# Patient Record
Sex: Female | Born: 1935 | Race: White | Hispanic: No | Smoking: Never smoker
Health system: Southern US, Community
[De-identification: ages and names within clinical notes are randomized; demographics above are authoritative.]

## PROBLEM LIST (undated history)

## (undated) DIAGNOSIS — C189 Malignant neoplasm of colon, unspecified: Secondary | ICD-10-CM

## (undated) DIAGNOSIS — I4891 Unspecified atrial fibrillation: Secondary | ICD-10-CM

## (undated) DIAGNOSIS — M1990 Unspecified osteoarthritis, unspecified site: Secondary | ICD-10-CM

## (undated) DIAGNOSIS — R001 Bradycardia, unspecified: Secondary | ICD-10-CM

## (undated) DIAGNOSIS — K279 Peptic ulcer, site unspecified, unspecified as acute or chronic, without hemorrhage or perforation: Secondary | ICD-10-CM

## (undated) DIAGNOSIS — H3530 Unspecified macular degeneration: Secondary | ICD-10-CM

## (undated) DIAGNOSIS — I495 Sick sinus syndrome: Secondary | ICD-10-CM

## (undated) DIAGNOSIS — F0390 Unspecified dementia without behavioral disturbance: Secondary | ICD-10-CM

## (undated) HISTORY — DX: Unspecified osteoarthritis, unspecified site: M19.90

## (undated) HISTORY — DX: Bradycardia, unspecified: R00.1

## (undated) HISTORY — PX: CATARACT EXTRACTION: SUR2

## (undated) HISTORY — DX: Malignant neoplasm of colon, unspecified: C18.9

## (undated) HISTORY — DX: Sick sinus syndrome: I49.5

## (undated) HISTORY — DX: Unspecified macular degeneration: H35.30

## (undated) HISTORY — DX: Unspecified atrial fibrillation: I48.91

## (undated) HISTORY — DX: Peptic ulcer, site unspecified, unspecified as acute or chronic, without hemorrhage or perforation: K27.9

## (undated) HISTORY — PX: CHOLECYSTECTOMY: SHX55

## (undated) HISTORY — PX: TONSILLECTOMY: SUR1361

---

## 1998-05-02 ENCOUNTER — Ambulatory Visit (HOSPITAL_BASED_OUTPATIENT_CLINIC_OR_DEPARTMENT_OTHER): Admission: RE | Admit: 1998-05-02 | Discharge: 1998-05-02 | Payer: Self-pay

## 2000-04-05 HISTORY — PX: PARTIAL COLECTOMY: SHX5273

## 2000-08-10 ENCOUNTER — Ambulatory Visit (HOSPITAL_COMMUNITY): Admission: RE | Admit: 2000-08-10 | Discharge: 2000-08-10 | Payer: Self-pay | Admitting: Internal Medicine

## 2000-08-10 ENCOUNTER — Encounter: Payer: Self-pay | Admitting: Internal Medicine

## 2000-09-08 ENCOUNTER — Encounter: Payer: Self-pay | Admitting: *Deleted

## 2000-09-08 ENCOUNTER — Ambulatory Visit (HOSPITAL_COMMUNITY): Admission: RE | Admit: 2000-09-08 | Discharge: 2000-09-08 | Payer: Self-pay | Admitting: *Deleted

## 2000-10-10 ENCOUNTER — Inpatient Hospital Stay (HOSPITAL_COMMUNITY): Admission: RE | Admit: 2000-10-10 | Discharge: 2000-10-15 | Payer: Self-pay

## 2001-08-02 ENCOUNTER — Ambulatory Visit (HOSPITAL_COMMUNITY): Admission: RE | Admit: 2001-08-02 | Discharge: 2001-08-03 | Payer: Self-pay | Admitting: Ophthalmology

## 2001-08-15 ENCOUNTER — Emergency Department (HOSPITAL_COMMUNITY): Admission: EM | Admit: 2001-08-15 | Discharge: 2001-08-15 | Payer: Self-pay | Admitting: Emergency Medicine

## 2002-05-10 ENCOUNTER — Ambulatory Visit (HOSPITAL_COMMUNITY): Admission: RE | Admit: 2002-05-10 | Discharge: 2002-05-10 | Payer: Self-pay | Admitting: Internal Medicine

## 2003-11-27 ENCOUNTER — Ambulatory Visit (HOSPITAL_COMMUNITY): Admission: RE | Admit: 2003-11-27 | Discharge: 2003-11-27 | Payer: Self-pay | Admitting: Internal Medicine

## 2004-10-16 ENCOUNTER — Ambulatory Visit: Payer: Self-pay | Admitting: Cardiology

## 2004-10-16 ENCOUNTER — Inpatient Hospital Stay (HOSPITAL_COMMUNITY): Admission: EM | Admit: 2004-10-16 | Discharge: 2004-10-17 | Payer: Self-pay | Admitting: Emergency Medicine

## 2004-11-02 ENCOUNTER — Ambulatory Visit: Payer: Self-pay | Admitting: Cardiology

## 2005-03-19 ENCOUNTER — Ambulatory Visit (HOSPITAL_COMMUNITY): Admission: RE | Admit: 2005-03-19 | Discharge: 2005-03-19 | Payer: Self-pay | Admitting: Family Medicine

## 2005-04-29 ENCOUNTER — Encounter (HOSPITAL_COMMUNITY): Admission: RE | Admit: 2005-04-29 | Discharge: 2005-04-30 | Payer: Self-pay | Admitting: Unknown Physician Specialty

## 2005-05-03 ENCOUNTER — Encounter (HOSPITAL_COMMUNITY): Admission: RE | Admit: 2005-05-03 | Discharge: 2005-06-02 | Payer: Self-pay | Admitting: Unknown Physician Specialty

## 2005-06-04 ENCOUNTER — Encounter (HOSPITAL_COMMUNITY): Admission: RE | Admit: 2005-06-04 | Discharge: 2005-07-04 | Payer: Self-pay | Admitting: Unknown Physician Specialty

## 2006-03-08 ENCOUNTER — Ambulatory Visit (HOSPITAL_COMMUNITY): Admission: RE | Admit: 2006-03-08 | Discharge: 2006-03-08 | Payer: Self-pay | Admitting: Family Medicine

## 2006-03-14 ENCOUNTER — Ambulatory Visit (HOSPITAL_COMMUNITY): Admission: RE | Admit: 2006-03-14 | Discharge: 2006-03-14 | Payer: Self-pay | Admitting: Family Medicine

## 2006-03-17 ENCOUNTER — Encounter (HOSPITAL_COMMUNITY): Admission: RE | Admit: 2006-03-17 | Discharge: 2006-04-04 | Payer: Self-pay | Admitting: Family Medicine

## 2006-08-19 ENCOUNTER — Ambulatory Visit (HOSPITAL_COMMUNITY): Admission: RE | Admit: 2006-08-19 | Discharge: 2006-08-20 | Payer: Self-pay | Admitting: Surgery

## 2006-08-19 ENCOUNTER — Encounter (INDEPENDENT_AMBULATORY_CARE_PROVIDER_SITE_OTHER): Payer: Self-pay | Admitting: Surgery

## 2006-10-03 ENCOUNTER — Encounter (INDEPENDENT_AMBULATORY_CARE_PROVIDER_SITE_OTHER): Payer: Self-pay | Admitting: *Deleted

## 2009-04-01 ENCOUNTER — Encounter: Payer: Self-pay | Admitting: Cardiovascular Disease

## 2009-04-01 ENCOUNTER — Ambulatory Visit (HOSPITAL_COMMUNITY): Admission: RE | Admit: 2009-04-01 | Discharge: 2009-04-01 | Payer: Self-pay | Admitting: Family Medicine

## 2009-04-16 ENCOUNTER — Encounter: Payer: Self-pay | Admitting: Orthopedic Surgery

## 2009-04-16 ENCOUNTER — Encounter (INDEPENDENT_AMBULATORY_CARE_PROVIDER_SITE_OTHER): Payer: Self-pay | Admitting: *Deleted

## 2009-04-16 DIAGNOSIS — Z8711 Personal history of peptic ulcer disease: Secondary | ICD-10-CM

## 2009-04-16 DIAGNOSIS — C189 Malignant neoplasm of colon, unspecified: Secondary | ICD-10-CM | POA: Insufficient documentation

## 2009-04-16 DIAGNOSIS — H3530 Unspecified macular degeneration: Secondary | ICD-10-CM | POA: Insufficient documentation

## 2009-04-16 DIAGNOSIS — M1610 Unilateral primary osteoarthritis, unspecified hip: Secondary | ICD-10-CM | POA: Insufficient documentation

## 2009-04-17 ENCOUNTER — Ambulatory Visit: Payer: Self-pay | Admitting: Orthopedic Surgery

## 2009-04-17 ENCOUNTER — Encounter: Payer: Self-pay | Admitting: Adult Health

## 2009-04-17 ENCOUNTER — Ambulatory Visit: Payer: Self-pay | Admitting: Cardiovascular Disease

## 2009-04-17 DIAGNOSIS — I10 Essential (primary) hypertension: Secondary | ICD-10-CM | POA: Insufficient documentation

## 2009-04-17 DIAGNOSIS — M1710 Unilateral primary osteoarthritis, unspecified knee: Secondary | ICD-10-CM

## 2009-04-21 ENCOUNTER — Encounter: Payer: Self-pay | Admitting: Cardiology

## 2009-04-21 ENCOUNTER — Ambulatory Visit (HOSPITAL_COMMUNITY): Admission: RE | Admit: 2009-04-21 | Discharge: 2009-04-21 | Payer: Self-pay | Admitting: Cardiology

## 2009-04-21 ENCOUNTER — Ambulatory Visit: Payer: Self-pay | Admitting: Cardiology

## 2009-04-25 ENCOUNTER — Encounter: Payer: Self-pay | Admitting: Cardiology

## 2009-04-28 ENCOUNTER — Encounter: Payer: Self-pay | Admitting: Cardiology

## 2009-04-28 ENCOUNTER — Encounter (INDEPENDENT_AMBULATORY_CARE_PROVIDER_SITE_OTHER): Payer: Self-pay | Admitting: *Deleted

## 2009-04-28 LAB — CONVERTED CEMR LAB
AST: 19 units/L
Albumin: 4.3 g/dL
BUN: 12 mg/dL
Creatinine, Ser: 0.73 mg/dL
Glomerular Filtration Rate, Af Am: >60 mL/min/{1.73_m2}
Glucose, Bld: 100 mg/dL
Potassium: 3.6 meq/L
Sodium: 137 meq/L
TSH: 4.689 microintl units/mL

## 2009-05-08 ENCOUNTER — Encounter (INDEPENDENT_AMBULATORY_CARE_PROVIDER_SITE_OTHER): Payer: Self-pay | Admitting: *Deleted

## 2009-05-09 ENCOUNTER — Encounter (INDEPENDENT_AMBULATORY_CARE_PROVIDER_SITE_OTHER): Payer: Self-pay | Admitting: *Deleted

## 2009-05-09 ENCOUNTER — Ambulatory Visit: Payer: Self-pay | Admitting: Cardiology

## 2009-05-09 ENCOUNTER — Ambulatory Visit (HOSPITAL_COMMUNITY): Admission: RE | Admit: 2009-05-09 | Discharge: 2009-05-09 | Payer: Self-pay | Admitting: Cardiology

## 2009-05-09 DIAGNOSIS — I495 Sick sinus syndrome: Secondary | ICD-10-CM

## 2009-05-09 LAB — CONVERTED CEMR LAB
BUN: 14 mg/dL
Calcium: 8.6 mg/dL
Glucose, Bld: 85 mg/dL
Sodium: 144 meq/L

## 2009-05-12 ENCOUNTER — Ambulatory Visit: Payer: Self-pay | Admitting: Internal Medicine

## 2009-05-12 ENCOUNTER — Inpatient Hospital Stay (HOSPITAL_COMMUNITY): Admission: RE | Admit: 2009-05-12 | Discharge: 2009-05-13 | Payer: Self-pay | Admitting: Internal Medicine

## 2009-05-12 ENCOUNTER — Encounter (INDEPENDENT_AMBULATORY_CARE_PROVIDER_SITE_OTHER): Payer: Self-pay | Admitting: *Deleted

## 2009-05-12 LAB — CONVERTED CEMR LAB
BUN: 14 mg/dL (ref 6–23)
Basophils Absolute: 0 10*3/uL (ref 0.0–0.1)
Chloride: 104 meq/L (ref 96–112)
Creatinine, Ser: 0.66 mg/dL (ref 0.40–1.20)
Eosinophils Relative: 2 % (ref 0–5)
HCT: 42.6 % (ref 36.0–46.0)
Hemoglobin: 13.7 g/dL (ref 12.0–15.0)
MCV: 84.7 fL (ref 78.0–100.0)
Monocytes Absolute: 0.6 10*3/uL (ref 0.1–1.0)
Monocytes Relative: 7 % (ref 3–12)
Neutro Abs: 5.6 10*3/uL (ref 1.7–7.7)
Platelets: 256 10*3/uL (ref 150–400)
Potassium: 3.4 meq/L — ABNORMAL LOW (ref 3.5–5.3)
Prothrombin Time: 13.9 s (ref 11.6–15.2)
WBC: 7.7 10*3/uL (ref 4.0–10.5)

## 2009-05-13 ENCOUNTER — Encounter: Payer: Self-pay | Admitting: Internal Medicine

## 2009-05-19 ENCOUNTER — Encounter: Payer: Self-pay | Admitting: Cardiology

## 2009-05-21 ENCOUNTER — Ambulatory Visit: Payer: Self-pay

## 2009-05-21 ENCOUNTER — Encounter: Payer: Self-pay | Admitting: Internal Medicine

## 2009-06-02 ENCOUNTER — Ambulatory Visit: Payer: Self-pay | Admitting: Orthopedic Surgery

## 2009-06-16 ENCOUNTER — Encounter (INDEPENDENT_AMBULATORY_CARE_PROVIDER_SITE_OTHER): Payer: Self-pay | Admitting: *Deleted

## 2009-06-19 ENCOUNTER — Ambulatory Visit: Payer: Self-pay | Admitting: Cardiology

## 2009-07-02 ENCOUNTER — Telehealth: Payer: Self-pay | Admitting: Cardiology

## 2009-07-10 ENCOUNTER — Encounter: Payer: Self-pay | Admitting: Cardiology

## 2009-07-10 LAB — CONVERTED CEMR LAB
BUN: 10 mg/dL (ref 6–23)
CO2: 26 meq/L (ref 19–32)
Sodium: 143 meq/L (ref 135–145)

## 2009-07-16 ENCOUNTER — Telehealth: Payer: Self-pay | Admitting: Cardiology

## 2009-07-21 ENCOUNTER — Ambulatory Visit: Payer: Self-pay | Admitting: Cardiovascular Disease

## 2009-07-22 ENCOUNTER — Encounter: Payer: Self-pay | Admitting: Cardiology

## 2009-08-04 ENCOUNTER — Encounter: Payer: Self-pay | Admitting: Internal Medicine

## 2009-08-20 ENCOUNTER — Ambulatory Visit: Payer: Self-pay | Admitting: Internal Medicine

## 2009-08-20 ENCOUNTER — Encounter: Payer: Self-pay | Admitting: Cardiology

## 2009-08-20 ENCOUNTER — Encounter (INDEPENDENT_AMBULATORY_CARE_PROVIDER_SITE_OTHER): Payer: Self-pay | Admitting: *Deleted

## 2009-08-20 DIAGNOSIS — I4892 Unspecified atrial flutter: Secondary | ICD-10-CM

## 2009-08-21 ENCOUNTER — Encounter: Payer: Self-pay | Admitting: Internal Medicine

## 2009-08-21 DIAGNOSIS — Z950 Presence of cardiac pacemaker: Secondary | ICD-10-CM

## 2009-09-02 LAB — CONVERTED CEMR LAB
Basophils Absolute: 0 10*3/uL (ref 0.0–0.1)
CO2: 29 meq/L (ref 19–32)
Chloride: 107 meq/L (ref 96–112)
Creatinine, Ser: 0.67 mg/dL (ref 0.40–1.20)
Eosinophils Relative: 1 % (ref 0–5)
Glucose, Bld: 152 mg/dL — ABNORMAL HIGH (ref 70–99)
HCT: 38.4 % (ref 36.0–46.0)
Hemoglobin: 12.8 g/dL (ref 12.0–15.0)
MCV: 86.4 fL (ref 78.0–100.0)
Monocytes Absolute: 0.5 10*3/uL (ref 0.1–1.0)
Monocytes Relative: 8 % (ref 3–12)
Neutro Abs: 4.9 10*3/uL (ref 1.7–7.7)
Neutrophils Relative %: 72 % (ref 43–77)
Platelets: 233 10*3/uL (ref 150–400)
Potassium: 4 meq/L (ref 3.5–5.3)
Sodium: 142 meq/L (ref 135–145)
aPTT: 29 s (ref 24–37)

## 2009-09-08 ENCOUNTER — Ambulatory Visit (HOSPITAL_COMMUNITY): Admission: RE | Admit: 2009-09-08 | Discharge: 2009-09-08 | Payer: Self-pay | Admitting: Internal Medicine

## 2009-09-08 ENCOUNTER — Encounter: Payer: Self-pay | Admitting: Internal Medicine

## 2009-09-08 ENCOUNTER — Ambulatory Visit: Payer: Self-pay | Admitting: Internal Medicine

## 2009-09-11 ENCOUNTER — Encounter (INDEPENDENT_AMBULATORY_CARE_PROVIDER_SITE_OTHER): Payer: Self-pay | Admitting: *Deleted

## 2009-09-11 ENCOUNTER — Ambulatory Visit: Payer: Self-pay | Admitting: Cardiology

## 2009-09-11 LAB — CONVERTED CEMR LAB: POC INR: 3

## 2009-09-15 ENCOUNTER — Ambulatory Visit: Payer: Self-pay | Admitting: Cardiology

## 2009-09-15 LAB — CONVERTED CEMR LAB: POC INR: 3.7

## 2009-09-18 ENCOUNTER — Ambulatory Visit: Payer: Self-pay | Admitting: Cardiology

## 2009-09-18 LAB — CONVERTED CEMR LAB: POC INR: 3.2

## 2009-09-22 ENCOUNTER — Ambulatory Visit: Payer: Self-pay | Admitting: Internal Medicine

## 2009-09-22 ENCOUNTER — Inpatient Hospital Stay (HOSPITAL_COMMUNITY): Admission: RE | Admit: 2009-09-22 | Discharge: 2009-09-24 | Payer: Self-pay | Admitting: Internal Medicine

## 2009-09-23 LAB — CONVERTED CEMR LAB
BUN: 12 mg/dL (ref 6–23)
Creatinine, Ser: 0.58 mg/dL (ref 0.40–1.20)
Eosinophils Absolute: 0.1 10*3/uL (ref 0.0–0.7)
Eosinophils Relative: 1 % (ref 0–5)
HCT: 40.3 % (ref 36.0–46.0)
Hemoglobin: 12.6 g/dL (ref 12.0–15.0)
Lymphs Abs: 1.2 10*3/uL (ref 0.7–4.0)
MCV: 89 fL (ref 78.0–100.0)
Monocytes Absolute: 0.5 10*3/uL (ref 0.1–1.0)
Neutro Abs: 3.8 10*3/uL (ref 1.7–7.7)
Neutrophils Relative %: 68 % (ref 43–77)
Platelets: 210 10*3/uL (ref 150–400)
Prothrombin Time: 30.2 s — ABNORMAL HIGH (ref 11.6–15.2)
RDW: 14.4 % (ref 11.5–15.5)
WBC: 5.6 10*3/uL (ref 4.0–10.5)

## 2009-09-29 ENCOUNTER — Ambulatory Visit: Payer: Self-pay | Admitting: Cardiology

## 2009-09-29 LAB — CONVERTED CEMR LAB: POC INR: 5.2

## 2009-10-01 ENCOUNTER — Ambulatory Visit: Payer: Self-pay | Admitting: Cardiology

## 2009-10-01 ENCOUNTER — Ambulatory Visit: Payer: Self-pay | Admitting: Internal Medicine

## 2009-10-01 LAB — CONVERTED CEMR LAB: POC INR: 3.9

## 2009-10-09 ENCOUNTER — Ambulatory Visit: Payer: Self-pay | Admitting: Cardiology

## 2009-10-09 LAB — CONVERTED CEMR LAB: POC INR: 4.4

## 2009-10-15 ENCOUNTER — Ambulatory Visit: Payer: Self-pay | Admitting: Cardiology

## 2009-10-15 LAB — CONVERTED CEMR LAB: POC INR: 3.6

## 2009-10-30 ENCOUNTER — Ambulatory Visit: Payer: Self-pay | Admitting: Cardiology

## 2009-10-30 LAB — CONVERTED CEMR LAB: POC INR: 3.2

## 2009-11-17 ENCOUNTER — Ambulatory Visit: Payer: Self-pay | Admitting: Cardiology

## 2009-12-04 ENCOUNTER — Ambulatory Visit: Payer: Self-pay | Admitting: Internal Medicine

## 2009-12-10 ENCOUNTER — Ambulatory Visit: Payer: Self-pay | Admitting: Cardiology

## 2009-12-29 ENCOUNTER — Telehealth (INDEPENDENT_AMBULATORY_CARE_PROVIDER_SITE_OTHER): Payer: Self-pay | Admitting: *Deleted

## 2009-12-31 ENCOUNTER — Ambulatory Visit: Payer: Self-pay | Admitting: Cardiology

## 2010-01-21 ENCOUNTER — Ambulatory Visit: Payer: Self-pay | Admitting: Cardiology

## 2010-02-04 ENCOUNTER — Ambulatory Visit: Payer: Self-pay | Admitting: Cardiology

## 2010-02-09 ENCOUNTER — Telehealth: Payer: Self-pay | Admitting: Internal Medicine

## 2010-02-23 ENCOUNTER — Ambulatory Visit: Payer: Self-pay | Admitting: Cardiology

## 2010-02-23 LAB — CONVERTED CEMR LAB: POC INR: 3.6

## 2010-03-12 ENCOUNTER — Encounter (INDEPENDENT_AMBULATORY_CARE_PROVIDER_SITE_OTHER): Payer: Self-pay | Admitting: *Deleted

## 2010-03-12 ENCOUNTER — Ambulatory Visit: Payer: Self-pay | Admitting: Cardiology

## 2010-03-12 LAB — CONVERTED CEMR LAB: POC INR: 2.5

## 2010-04-09 ENCOUNTER — Encounter: Payer: Self-pay | Admitting: Internal Medicine

## 2010-04-09 ENCOUNTER — Ambulatory Visit: Admission: RE | Admit: 2010-04-09 | Discharge: 2010-04-09 | Payer: Self-pay | Source: Home / Self Care

## 2010-04-09 ENCOUNTER — Ambulatory Visit
Admission: RE | Admit: 2010-04-09 | Discharge: 2010-04-09 | Payer: Self-pay | Source: Home / Self Care | Attending: Internal Medicine | Admitting: Internal Medicine

## 2010-04-09 DIAGNOSIS — R0602 Shortness of breath: Secondary | ICD-10-CM | POA: Insufficient documentation

## 2010-04-09 LAB — CONVERTED CEMR LAB: POC INR: 2.4

## 2010-04-10 LAB — CONVERTED CEMR LAB
Basophils Absolute: 0 10*3/uL (ref 0.0–0.1)
Eosinophils Absolute: 0.1 10*3/uL (ref 0.0–0.7)
Eosinophils Relative: 1 % (ref 0–5)
HCT: 35.5 % — ABNORMAL LOW (ref 36.0–46.0)
Lymphocytes Relative: 8 % — ABNORMAL LOW (ref 12–46)
MCV: 87.2 fL (ref 78.0–100.0)
Platelets: 344 10*3/uL (ref 150–400)
RDW: 15.9 % — ABNORMAL HIGH (ref 11.5–15.5)
Sed Rate: 55 mm/hr — ABNORMAL HIGH (ref 0–22)

## 2010-04-13 ENCOUNTER — Encounter (INDEPENDENT_AMBULATORY_CARE_PROVIDER_SITE_OTHER): Payer: Self-pay | Admitting: *Deleted

## 2010-04-26 ENCOUNTER — Encounter: Payer: Self-pay | Admitting: Family Medicine

## 2010-04-26 ENCOUNTER — Encounter: Payer: Self-pay | Admitting: Internal Medicine

## 2010-05-05 NOTE — Assessment & Plan Note (Signed)
Summary: 6 WK RE-CK LT KNEE/MEDICARE,CENTR RES/CAF   Visit Type:  Follow-up Referring Provider:  Dr Nobie Putnam  Primary Provider:  Dr Nobie Putnam   CC:  left knee fu.  History of Present Illness: I saw Wendy Garner in the office today for a followup visit.  She is a 77 years old woman with the complaint of:  DX: Left knee pain, OA.  Fell injured her LEFT knee, now doing better.  Likes to wear the brace thinks it's giving her more support  Treatment: brace, walker, voltaren gel.  Complaints: brace helped.  Today, scheduled for: 6 week recheck left knee.  She has valgus alignment to the LEFT knee.  No pain or tenderness today.  No swelling.  Muscle tone and strength are normal.  Knee is stable.  Impression LEFT knee pain resolved  Use brace as needed encouraged to use cane warned about falling and need for hip surgery and then she would have to go to a nursing home.  Follow up as needed    Current Medications (verified): 1)  Nexium 40 Mg Cpdr (Esomeprazole Magnesium) .... Take 1 Tab Daily 2)  Eye Drops 0.05 % Soln (Tetrahydrozoline Hcl) .... Use As Directed 3)  Synthroid 125 Mcg Tabs (Levothyroxine Sodium) .... Take 1 Tab Daily 4)  Daily Multiple Vitamins  Tabs (Multiple Vitamin) .... Take 1 Tab Daily 5)  Vision Vitamins  Tabs (Multiple Vitamins-Minerals) .... Take 1 Tab Daily 6)  Calcium Carbonate 600 Mg Tabs (Calcium Carbonate) .... Take 1 Tab Daily 7)  Voltaren 1 % Gel (Diclofenac Sodium) .... Take As Directed 8)  Metoprolol Succinate 100 Mg Xr24h-Tab (Metoprolol Succinate) .... Take One Tablet By Mouth Daily 9)  Diltiazem Hcl Er Beads 120 Mg Xr24h-Cap (Diltiazem Hcl Er Beads) .... Take One Capsule By Mouth Daily  Allergies (verified): No Known Drug Allergies  Past History:  Past Medical History: Last updated: 06/03/09 Sick sinus syndrome: Paroxysmal atrial fibrillation with a rapid ventricular response;       extreme sinus bradycardia, junctional bradycardia and  sinus arrest up to 4 seconds MACULAR DEGENERATION (ICD-362.50) Peptic ulcer disease with history of upper GI bleeding in 2004 Degenerative joint disease-hip and lumbosacral spine ADENOCARCINOMA, COLON (ICD-153.9) ATRIAL FIBRILLATION, HX OF (ICD-V12.59)  Past Surgical History: Last updated: Jun 03, 2009 Cataract extraction Cholecystectomy Tonsillectomy Partial colectomy for carcinoma of the colon-2002  Family History: Last updated: 06/03/09 Mother died at an advanced stage; cause unknown Father suffered a fatal CVA Siblings: 4 sisters, 3 of whom died with neoplastic disease  Social History: Last updated: 06/03/2009 Employment:Retired from work with [employer] and resides in [place]; 2 adult children No history of tobacco use No history of significant alcohol use  Risk Factors: Alcohol Use: 0 (04/17/2009)  Risk Factors: Smoking Status: never (04/17/2009)   Impression & Recommendations:  Problem # 1:  KNEE, ARTHRITIS, DEGEN./OSTEO (ICD-715.96) Assessment Improved  Orders: Est. Patient Level III ([address])  Patient Instructions: 1)  You can take the brace off  2)  Use a cane or walker as needed  3)  f/u as needed

## 2010-05-05 NOTE — Letter (Signed)
Summary: BP READING  BP READING   Imported By: Faythe Ghee 08/20/2009 16:05:36  _____________________________________________________________________  External Attachment:    Type:   Image     Comment:   External Document

## 2010-05-05 NOTE — Assessment & Plan Note (Signed)
Summary: eph   Visit Type:  Follow-up Referring Provider:  Dr Nobie Putnam  Primary Provider:  Dr Nobie Putnam   CC:  SOB.  History of Present Illness: Wendy Garner is referred for evaluation of atrial flutter.  She is a pleasant elderly woman with a h/o symptomatic bradycardia who is s/p PPM insertion.  She has felt poorly over the last two months and this coincides with the development of atrial flutter.  The patient does not fee palpitations but she notes that with exertion, she has dyspnea.  No other complaints.  Current Medications (verified): 1)  Nexium 40 Mg Cpdr (Esomeprazole Magnesium) .... Take 1 Tab Daily 2)  Eye Drops 0.05 % Soln (Tetrahydrozoline Hcl) .... Use As Directed 3)  Synthroid 125 Mcg Tabs (Levothyroxine Sodium) .... Take 1 Tab Daily 4)  Daily Multiple Vitamins  Tabs (Multiple Vitamin) .... Take 1 Tab Daily 5)  Vision Vitamins  Tabs (Multiple Vitamins-Minerals) .... Take 1 Tab Daily 6)  Calcium Carbonate 600 Mg Tabs (Calcium Carbonate) .... Take 1 Tab Daily 7)  Metoprolol Succinate 100 Mg Xr24h-Tab (Metoprolol Succinate) .... Take One Tablet By Mouth Daily 8)  Diltiazem Hcl Er Beads 240 Mg Xr24h-Cap (Diltiazem Hcl Er Beads) .... Take 1 Tablet By Mouth Once Daily  Allergies (verified): No Known Drug Allergies  Past History:  Past Medical History: Last updated: 05-16-09 Sick sinus syndrome: Paroxysmal atrial fibrillation with a rapid ventricular response;       extreme sinus bradycardia, junctional bradycardia and sinus arrest up to 4 seconds MACULAR DEGENERATION (ICD-362.50) Peptic ulcer disease with history of upper GI bleeding in 2004 Degenerative joint disease-hip and lumbosacral spine ADENOCARCINOMA, COLON (ICD-153.9) ATRIAL FIBRILLATION, HX OF (ICD-V12.59)  Past Surgical History: Last updated: 2009-05-16 Cataract extraction Cholecystectomy Tonsillectomy Partial colectomy for carcinoma of the colon-2002  Family History: Last updated: May 16, 2009 Mother  died at an advanced stage; cause unknown Father suffered a fatal CVA Siblings: 4 sisters, 3 of whom died with neoplastic disease  Social History: Last updated: 2009-05-16 Employment:Retired from work with [employer] and resides in [place]; 2 adult children No history of tobacco use No history of significant alcohol use  Review of Systems       The patient complains of dyspnea on exertion.  The patient denies chest pain, syncope, and peripheral edema.    Vital Signs:  Patient profile:   77 year old female Weight:      133 pounds BMI:     20.30 Pulse rate:   115 / minute BP sitting:   121 / 73  (right arm)  Vitals Entered By: Dreama Saa, CNA (Aug 20, 2009 2:00 PM)  Physical Exam  General:    Well developed; no acute distress; proportionate height and weight Neck-No JVD; no carotid bruits: Lungs-normal respiratory rate; clear lung fields except for minimal left basilar rales. Thorax-pacing generator implanted in the left infraclavicular region; no edema or hematoma; well-healed incision. Cardiovascular-normal PMI; normal S1 and S2; modest basilar systolic ejection murmur Abdomen-BS normal; soft and non-tender without masses or organomegaly:  Musculoskeletal-No deformities, no cyanosis or clubbing: Neurologic-Normal cranial nerves; symmetric strength and tone:  Skin-Warm, no significant lesions: Extremities-Nl distal pulses; no edema:     EKG  Procedure date:  08/20/2009  Findings:      Atrial flutter with a ventricular response rate of:  115/min.  PPM Specifications Following MD:  Lewayne Bunting, MD     PPM Vendor:  Medtronic     PPM Model Number:  [id number]  PPM Serial Number:  [id number] PPM DOI:  05/12/2009     PPM Implanting MD:  Sherryl Manges, MD  Lead 1    Location: RA     DOI: 05/12/2009     Model #: 1944     Serial #: [id number]     Status: active Lead 2    Location: RV     DOI: 05/12/2009     Model #: 1946     Serial #: [id number]     Status:  active  Magnet Response Rate:  BOL 85 ERI  65  Indications:  Sick sinus syndrome   PPM Follow Up Remote Check?  No Battery Voltage:  2.79 V     Battery Est. Longevity:  13 years     Pacer Dependent:  No       PPM Device Measurements Atrium  Amplitude: 0.7 mV, Impedance: 518 ohms, Threshold: 0.5 V at 0.4 msec Right Ventricle  Amplitude: 16 mV, Impedance: 951 ohms, Threshold: 0.375 V at 0.4 msec  Episodes MS Episodes:  1518     Percent Mode Switch:  76.5%     Coumadin:  No Ventricular High Rate:  1     Atrial Pacing:  13.1%     Ventricular Pacing:  6%  Parameters Mode:  MVP (R)     Lower Rate Limit:  60     Upper Rate Limit:  130 Paced AV Delay:  150     Sensed AV Delay:  120 Tech Comments:  A-flutter with RVR, - coumadin.  Wendy Garner c/o SOB @ rest.  18.7% total heart beats > 100bpm.   Altha Harm, LPN  Aug 20, 2009 2:18 PM  MD Comments:  Agree with above.  Impression & Recommendations:  Problem # 1:  ATRIAL FLUTTER (ICD-427.32)  Her symptoms are not well controlled.  She is symptomatic and her rate is increased despite medical therapy.  I have discussed the risks/benefits/goals/expectations with the patient and her daughter and she wishes to proceed. Her updated medication list for this problem includes:    Metoprolol Succinate 100 Mg Xr24h-tab (Metoprolol succinate) .Marland Kitchen... Take one tablet by mouth daily    Warfarin Sodium 3 Mg Tabs (Warfarin sodium) ..... Use as directed by anticoagualtion clinic  Orders: Ablation (Ablation) Trans Esophageal Echocardiogram (TEE)Future Orders: T-Protime, Auto ([id number]) ... 09/03/2009 T-PTT ([id number]) ... 09/03/2009 T-CBC w/Diff ([id number]) ... 09/03/2009 T-Basic Metabolic Panel [id number]) ... 09/03/2009  Problem # 2:  HYPERTENSION (ICD-401.9) Her blood pressure is reasonably well controlled.  She will continue her current meds and maintain a low sodium diet. Her updated medication list for this problem includes:     Metoprolol Succinate 100 Mg Xr24h-tab (Metoprolol succinate) .Marland Kitchen... Take one tablet by mouth daily    Diltiazem Hcl Er Beads 240 Mg Xr24h-cap (Diltiazem hcl er beads) .Marland Kitchen... Take 1 tablet by mouth once daily  Future Orders: T-Protime, Auto ([id number]) ... 09/03/2009 T-PTT ([id number]) ... 09/03/2009 T-CBC w/Diff ([id number]) ... 09/03/2009 T-Basic Metabolic Panel [id number]) ... 09/03/2009  Problem # 3:  CARDIAC PACEMAKER IN SITU (ICD-V45.01) Her device is working normally.  Will followup after ablation.  New Orders:     1)  T-Protime, Auto [id number])  Due: 09/03/2009     2)  T-PTT [id number])  Due: 09/03/2009     3)  T-CBC w/Diff ([id number])  Due: 09/03/2009     4)  T-Basic Metabolic Panel ([id number])  Due: 09/03/2009     5)  Ablation (Ablation)  Due: 08/20/2009     6)  Trans Esophageal Echocardiogram (TEE)  Due: 08/20/2009   Patient Instructions: 1)  Your physician recommends that you schedule a follow-up appointment in: post procedure 2)  Your physician recommends that you return for lab work in: 09/01/09 3)  Your physician has recommended that you have an ablation.  Catheter ablation is a medical procedure used to treat some cardiac arrhythmias (irregular heartbeats). During catheter ablation, a long, thin, flexible tube is put into a blood vessel in your groin (upper thigh), or neck. This tube is called an ablation catheter. It is then guided to your heart through the blood vessel. Radiofrequency waves destroy small areas of heart tissue where abnormal heartbeats may cause an arrhythmia to start.  Please see the instruction sheet given to you today. 4)  Your physician has requested that you have a TEE/Cardioversion.  During a TEE, sound waves are used to create images of your heart. It provides your doctor with information about the size and shape of your heart and how well your heart's chambers and valves are working. In this test, a transducer is attached to the end of a  flexible tube that is guided down your throat and into your esophagus (the tube leading from your mouth to your stomach) to get a more detailed image of your heart. Once the TEE has determined that a blood clot is not present, the cardioversion begins.  Electrical cardioversion uses a jolt of electricity to your heart either through paddles or wired patches attached to your chest. This is a controlled, usually prescheduled, procedure. This procedure is done at the hospital and you are not awake during the procedure.  You usually go home the day of the procedure. Please see the instruction sheet given to you today for more information. 5)  Your physician has recommended you make the following change in your medication: warfarin 3 mg daily, start on 09/04/09 Prescriptions: WARFARIN SODIUM 3 MG TABS (WARFARIN SODIUM) Use as directed by Anticoagualtion Clinic  #30 x 1   Entered by:   Teressa Lower RN   Authorized by:   Laren Boom, MD, [facility]   Signed by:   Teressa Lower RN on 08/20/2009   Method used:   Electronically to        [facility] (retail)       [address]       [place], [place]  [address]       Ph: [phone] or [phone]       Fax: [phone]   RxID:   [id number]

## 2010-05-05 NOTE — Progress Notes (Signed)
Summary: pt having sob not eating or sleeping   Phone Note Call from Patient Call back at Home Phone [phone] Call back at [phone]   Caller: Daughter/ Lady Gary Summary of Call: Pt not sleeping well not eating and having sob was told to call  here by the [facility] office,daughter want pt seen this week here in [place] office  Initial call taken by: Judie Grieve,  February 09, 2010 8:36 AM  Follow-up for Phone Call        will follow up with her primary doctor first  She has an apponitment on Friday Dennis Bast, RN, BSN  February 09, 2010 12:36 PM

## 2010-05-05 NOTE — Progress Notes (Signed)
  Phone Note Call from Patient Call back at [phone]   Caller: Daughter Wendy Garner) Reason for Call: Talk to Nurse Summary of Call: pt's daughter wants you to call her b/c she has question she wants to ask you/tg Initial call taken by: Raechel Ache [facility],  July 02, 2009 10:17 AM  Follow-up for Phone Call        pt and family could not remember why she needed blood work, explained her hypokalemia in February and this is a recheck Follow-up by: Teressa Lower RN,  July 02, 2009 10:56 AM

## 2010-05-05 NOTE — Assessment & Plan Note (Signed)
Summary: 1 MTH F/U PER CHECKOUT ON 06/19/09/TG  Nurse Visit   Vital Signs:  Patient profile:   77 year old female Weight:      133 pounds Pulse rate:   91 / minute BP sitting:   157 / 83  (left arm)  Vitals Entered By: Larita Fife Via LPN (July 21, 2009 9:50 AM)  Primary Provider:  Dr Nobie Putnam    History of Present Illness: S: Pt. arrives in office for BP check/nurse visit. B: Pt. started taking Diltiazen 120mg  once daily and Metoprolol 100mg  once daily on hosp. discharge of 05-23-09 post pacemaker placement. A: Pt. c/o extreme dizziness that only occured for a short while yesterday and rapid HB. Pt. denies stomach pain at this time. BP= 157/83 which is higher than most home BP's (scanned into chart).  AVG. SBP=129 and AVG. DBP=75. Orthostatic BP's entered in chart. R: Pt. advised to sit and stand up slowly and if there are any changes in her medical treatment we will contact her.    07/24/09:  Increase diltiazem to 240 mg. once daily.   Have pacemaker checked by Rep. and have him leave report for my review.  [place] Bing, M.D.    Pt. advised and she expressed verbal understanding. [employer] rep will check pt's device on Tues, 07-29-09 @ 3:00pm.        Larita Fife Via LPN  July 24, 2009 4:16 PM   Impression & Recommendations:  Problem # 1:  SICK SINUS SYNDROME (ICD-427.81) 07/24/09:  Increase diltiazem to 240 mg. once daily.   Have pacemaker checked by Rep. and have him leave report for my review.  [place] Bing, M.D.   Serial Vital Signs/Assessments:  Time      Position  BP       Pulse  Resp  Temp     By 9:50 AM   Lying LA  155/82   82                    Lynn Via LPN 3:08 AM   Sitting   138/80   73                    Lynn Via LPN 6:57 AM   Standing  149/79   70                    Lynn Via LPN  Comments: 8:46 AM No complaints By: Larita Fife Via LPN    Allergies: No Known Drug Allergies  Appended Document: 1 MTH F/U PER CHECKOUT ON 06/19/09/TG    Prescriptions: DILTIAZEM  HCL ER BEADS 240 MG XR24H-CAP (DILTIAZEM HCL ER BEADS) take 1 tablet by mouth once daily  #30 x 6   Entered by:   Larita Fife Via LPN   Authorized by:   Kathlen Brunswick, MD, [facility]   Signed by:   Larita Fife Via LPN on 96/29/5284   Method used:   Electronically to        [facility] (retail)       [address]       [place], [place]  [address]       Ph: [phone] or [phone]       Fax: [phone]   RxID:   [id number]

## 2010-05-05 NOTE — Letter (Signed)
Summary: BP READINGS  BP READINGS   Imported By: Faythe Ghee 07/22/2009 09:59:53  _____________________________________________________________________  External Attachment:    Type:   Image     Comment:   External Document

## 2010-05-05 NOTE — Medication Information (Signed)
Summary: ccr-lr  Anticoagulant Therapy  Managed by: Vashti Hey, RN PCP: [facility]  Supervising MD: Daleen Squibb MD, Darene Lamer Used: [facility] [place] Site: [place] INR POC 2.5  Dietary changes: no    Health status changes: no    Bleeding/hemorrhagic complications: no    Recent/future hospitalizations: no    Any changes in medication regimen? no    Recent/future dental: no  Any missed doses?: no       Is patient compliant with meds? yes       Allergies: No Known Drug Allergies  Anticoagulation Management History:      The patient is taking warfarin and comes in today for a routine follow up visit.  Positive risk factors for bleeding include an age of 75 years or older.  The bleeding index is 'intermediate risk'.  Positive CHADS2 values include History of HTN and Age > 67 years old.  Her last INR was 2.91.  Anticoagulation responsible provider: Daleen Squibb MD, Maisie Fus.  INR POC: 2.5.  Cuvette Lot#: [id number].    Anticoagulation Management Assessment/Plan:      The patient's current anticoagulation dose is Warfarin sodium 3 mg tabs: Use as directed by Anticoagualtion Clinic.  The target INR is 2.0-3.0.  The next INR is due 04/09/2010.  Anticoagulation instructions were given to patient.  Results were reviewed/authorized by Vashti Hey, RN.  She was notified by Vashti Hey RN.         Prior Anticoagulation Instructions: INR 3.6 Decrease coumadin to 1/2 tablet once daily except none on Mondays and Fridays  Current Anticoagulation Instructions: INR 2.5 Continue coumadin 1.5mg  once daily except none on Mondays and Fridays

## 2010-05-05 NOTE — Letter (Signed)
Summary: Handout Printed  Printed Handout:  - Diet - Seasoning without Salt 

## 2010-05-05 NOTE — Medication Information (Signed)
Summary: ccr-lr  Anticoagulant Therapy  Managed by: Vashti Hey, RN PCP: Dr Nobie Putnam  Supervising MD: Dietrich Pates MD, Darlyn Read Used: [facility] [place] Site: [place] INR POC 3.7  Dietary changes: no    Health status changes: no    Bleeding/hemorrhagic complications: no    Recent/future hospitalizations: no    Any changes in medication regimen? no    Recent/future dental: no  Any missed doses?: no       Is patient compliant with meds? yes       Allergies: No Known Drug Allergies  Anticoagulation Management History:      The patient is taking warfarin and comes in today for a routine follow up visit.  Positive risk factors for bleeding include an age of 60 years or older.  The bleeding index is 'intermediate risk'.  Positive CHADS2 values include History of HTN and Age > 60 years old.  Her last INR was 1.08.  Anticoagulation responsible provider: Dietrich Pates MD, Molly Maduro.  INR POC: 3.7.  Cuvette Lot#: [id number].    Anticoagulation Management Assessment/Plan:      The patient's current anticoagulation dose is Warfarin sodium 3 mg tabs: Use as directed by Anticoagualtion Clinic.  The target INR is 2.0-3.0.  The next INR is due 09/18/2009.  Anticoagulation instructions were given to patient.  Results were reviewed/authorized by Vashti Hey, RN.  She was notified by Vashti Hey RN.         Prior Anticoagulation Instructions: INR 3.0 States INR was 2.03 on 09/08/09 Take coumadin 3mg  once daily except 1.5mg  on Friday.  Recheck INR on 09/15/09  Current Anticoagulation Instructions: INR 3.7 Hold coumadin tonight then decrease coumadin to 3mg  qd except 1.5mg  on Mondays and Thursdays   Appended Document: ccr-lr Pt has already taken coumadin today.  She will hold coumadin tomorrow then decrease dose to 3mg  once daily except 1.5mg  on Mondays and Thursdays.

## 2010-05-05 NOTE — Assessment & Plan Note (Signed)
Summary: left knee pain xr [facility]   Visit Type:  Initial   Referring Provider:  Dr Nobie Putnam  Primary Provider:  Dr Nobie Putnam   CC:  left knee pain .  History of Present Illness: I saw Wendy Garner in the office today for an initial visit.  She is a 77 years old woman with the complaint of:  LEFT KNEE PAIN.  chief complaint: LEFT KNEE.  referral from Dr. Nobie Putnam for eval and treat.  The patient is 77 years old she got up and her LEFT knee gave way.  She does not complain of pain chest weakness and giving way of the LEFT knee.  She has a history of arthritis has some intermittent pain in the knee but none at this time.  She is currently using a walker and full tear and gel.   the symptoms started on December 21 and she has noticed some swelling  -xrays done & where: [facility] on 04-01-09. Tricompartmental degenerative changes with loose ossified bodies noted in the suprapatellar bursa region.  MEDS:  Voltaren gel for pain    Allergies: No Known Drug Allergies  Past History:  Past Surgical History: cataract surgery (bilaterial or which eye is not noted) cholecystectomy tonsillectomy Breast Thyroid Colon cancer  Review of Systems Cardiac :  Complains of chest pain; denies angina, heart attack, heart failure, poor circulation, blood clots, and phlebitis. Resp:  Complains of short of breath; denies difficulty breathing, COPD, cough, and pneumonia. GI:  Complains of reflux; denies nausea, vomiting, diarrhea, constipation, difficulty swallowing, ulcers, and GERD. MS:  Complains of joint pain; denies rheumatoid arthritis, joint swelling, gout, bone cancer, osteoporosis, and . Endo:  Complains of thyroid disease; denies goiter and diabetes. EENT:  Complains of poor vision, cataracts, and poor hearing; denies glaucoma, vertigo, ears ringing, sinusitis, hoarseness, toothaches, and bleeding gums.  The review of systems is negative for General, GU,  Neuro, Psych, Derm, Immunology, and Lymphatic.  Physical Exam  Msk:  The patient is well developed well nourished with no deformities. Awake, alert and oriented x 3. Mood is normal. Sensory exam normal. Perfusion of the limbs is normal. Skin intact. Lymph nodes normal  MSK: LEFT knee exam There is tenderness over the medial compartment with no joint effusion she is normal extension power her knee is completely stable all ligaments tested.  She is a range of motion of 5-125  Her RIGHT knee shows no tenderness swelling her range of motion is similar her knee is stable    Impression & Recommendations:  Problem # 1:  KNEE, ARTHRITIS, DEGEN./OSTEO ([facility]-932.35) Assessment New  The x-rays were done at [facility]. The report and the films have been reviewed.   I see no ligament instability but I will go ahead and put a brace on the knee advised to use a walker and come back and see me in 6 weeks for reevaluation she did have some medial joint line tenderness so may be a meniscal tear is possible but I don't think so.  We'll continue to follow  Orders: New Patient Level III (57322)  Patient Instructions: 1)  Wear Brace left knee x 6 weeks  2)  Use walker  3)  use gel as needed  4)  return in 6 weeks

## 2010-05-05 NOTE — Miscellaneous (Signed)
Summary: LABS CMP,TSH,04/28/2009  Clinical Lists Changes  Observations: Added new observation of CALCIUM: 8.5 mg/dL (30/86/5784 69:62) Added new observation of ALBUMIN: 4.3 g/dL (95/28/4132 44:01) Added new observation of PROTEIN, TOT: 6.6 g/dL (02/72/5366 44:03) Added new observation of SGPT (ALT): 11 units/L (04/28/2009 16:57) Added new observation of SGOT (AST): 19 units/L (04/28/2009 16:57) Added new observation of ALK PHOS: 69 units/L (04/28/2009 16:57) Added new observation of GFR AA: >60 mL/min/1.28m2 (04/28/2009 16:57) Added new observation of GFR: >60 mL/min (04/28/2009 16:57) Added new observation of CREATININE: 0.73 mg/dL (47/42/5956 38:75) Added new observation of BUN: 12 mg/dL (64/33/2951 88:41) Added new observation of BG RANDOM: 100 mg/dL (66/09/3014 01:09) Added new observation of CO2 PLSM/SER: 27 meq/L (04/28/2009 16:57) Added new observation of CL SERUM: 100 meq/L (04/28/2009 16:57) Added new observation of K SERUM: 3.6 meq/L (04/28/2009 16:57) Added new observation of NA: 137 meq/L (04/28/2009 16:57) Added new observation of TSH: 4.689 microintl units/mL (04/28/2009 16:57)

## 2010-05-05 NOTE — Medication Information (Signed)
Summary: ccr  Anticoagulant Therapy  Managed by: Wendy Hey, RN PCP: Wendy Judene Companion MD: Wendy Browner MD, Wendy Garner Used: [facility] [place] Site: [place] INR POC 3.6  Dietary changes: no    Health status changes: no    Bleeding/hemorrhagic complications: no    Recent/future hospitalizations: no    Any changes in medication regimen? no    Recent/future dental: no  Any missed doses?: no       Is patient compliant with meds? yes       Allergies: No Known Drug Allergies  Anticoagulation Management History:      The patient is taking warfarin and comes in today for a routine follow up visit.  Positive risk factors for bleeding include an age of 75 years or older.  The bleeding index is 'intermediate risk'.  Positive CHADS2 values include History of HTN and Age > 79 years old.  Her last INR was 2.91.  Anticoagulation responsible provider: Diona Browner MD, Remi Deter.  INR POC: 3.6.  Cuvette Lot#: [id number].    Anticoagulation Management Assessment/Plan:      The patient's current anticoagulation dose is Warfarin sodium 3 mg tabs: Use as directed by Anticoagualtion Clinic.  The target INR is 2.0-3.0.  The next INR is due 10/30/2009.  Anticoagulation instructions were given to patient.  Results were reviewed/authorized by Wendy Hey, RN.  She was notified by Wendy Hey RN.        Coagulation management information includes: Scheduled for ablation 09/22/09   Ablation was cancelled and DCCV performed 09/22/09  Started on amiodarone 200mg  two times a day 09/23/09  Will decrease amiodarone to 200mg  once daily on 10/22/09.  Prior Anticoagulation Instructions: INR 4.4 Hold coumadin tonight and tomorrow night then decrease dose to 1.5mg  once daily except 3mg  on Mondays and Fridays  Current Anticoagulation Instructions: INR 3.6 Hold coumadin tonight then decrease dose to 1.5mg  once daily except 3mg  on Mondays

## 2010-05-05 NOTE — Letter (Signed)
Summary: Implantable Device Instructions  [facility] [place]  [address], [place] [address]   Phone: [phone]  Fax: [phone]      Implantable Device Instructions  You are scheduled for:  __X___ Permanent Transvenous Pacemaker _____ Implantable Cardioverter Defibrillator _____ Implantable Loop Recorder _____ Generator Change  on  05/12/2009 with Dr.KLEIN_____.  1.  Please arrive at the Short Stay Center at [facility] at _1:30PM on the day of your procedure.  4.  Do NOT take these medications for ____ days prior to your procedure:  _________________________.  Take your last dose of Coumadin on ________.  5.  Plan for an overnight stay.  Bring your insurance cards and a list of your medications.  6.  Wash your chest and neck with antibacterial soap (any brand) the evening before and the morning of your procedure.  Rinse well.  7.  Education material received:     Pacemaker __x___           ICD _____           Arrhythmia _____  *If you have ANY questions after you get home, please call the office [phone].  *Every attempt is made to prevent procedures from being rescheduled.  Due to the nauture of Electrophysiology, rescheduling can happen.  The physician is always aware and directs the staff when this occurs.

## 2010-05-05 NOTE — Medication Information (Signed)
Summary: ccr-lr  Anticoagulant Therapy  Managed by: Vashti Hey, RN PCP: Robbie Lis  Supervising MD: Dietrich Pates MD, Darlyn Read Used: [facility] [place] Site: [place] INR POC 2.3  Dietary changes: no    Health status changes: no    Bleeding/hemorrhagic complications: no    Recent/future hospitalizations: no    Any changes in medication regimen? no    Recent/future dental: no  Any missed doses?: no       Is patient compliant with meds? yes       Allergies: No Known Drug Allergies  Anticoagulation Management History:      The patient is taking warfarin and comes in today for a routine follow up visit.  Positive risk factors for bleeding include an age of 28 years or older.  The bleeding index is 'intermediate risk'.  Positive CHADS2 values include History of HTN and Age > 53 years old.  Her last INR was 2.91.  Anticoagulation responsible provider: Dietrich Pates MD, Molly Maduro.  INR POC: 2.3.  Cuvette Lot#: [id number].    Anticoagulation Management Assessment/Plan:      The patient's current anticoagulation dose is Warfarin sodium 3 mg tabs: Use as directed by Anticoagualtion Clinic.  The target INR is 2.0-3.0.  The next INR is due 02/23/2010.  Anticoagulation instructions were given to patient.  Results were reviewed/authorized by Vashti Hey, RN.  She was notified by Vashti Hey RN.         Prior Anticoagulation Instructions: INR 4.1 Hold coumadin tonight then decrease dose to 1.5mg  once daily except none on Saturdays  Current Anticoagulation Instructions: INR 2.3 Continue coumadin 1/2 tablet once daily except none on Saturdays

## 2010-05-05 NOTE — Letter (Signed)
Summary: [facility] [employer]  [address] [place] [address]   Phone: [phone]  Fax: [phone]     May 12, 2009 MRN: [id number]   RAINEY RODGER [address] [place], [place]  [address]      YOUR LAB WORK IS DUE   __________APRIL 7, 2011_______________________________  Please go to [facility], located across the street from [facility] on the second floor.  Hours are Monday - Friday 7am until 7:30pm         Saturday 8am until 12noon    __  DO NOT EAT OR DRINK AFTER MIDNIGHT EVENING PRIOR TO LABWORK  _X_ YOUR LABWORK IS NOT FASTING --YOU MAY EAT PRIOR TO LABWORK

## 2010-05-05 NOTE — Medication Information (Signed)
Summary: ccr  Anticoagulant Therapy  Managed by: Vashti Hey, RN PCP: Dr Judene Companion MD: Diona Browner MD, Alexander Mt Used: [facility] [place] Site: [place] INR POC 3.2  Dietary changes: no    Health status changes: no    Bleeding/hemorrhagic complications: no    Recent/future hospitalizations: no    Any changes in medication regimen? no    Recent/future dental: no  Any missed doses?: no       Is patient compliant with meds? yes       Allergies: No Known Drug Allergies  Anticoagulation Management History:      The patient is taking warfarin and comes in today for a routine follow up visit.  Positive risk factors for bleeding include an age of 75 years or older.  The bleeding index is 'intermediate risk'.  Positive CHADS2 values include History of HTN and Age > 36 years old.  Her last INR was 2.91.  Anticoagulation responsible provider: Diona Browner MD, Remi Deter.  INR POC: 3.2.  Cuvette Lot#: [id number].    Anticoagulation Management Assessment/Plan:      The patient's current anticoagulation dose is Warfarin sodium 3 mg tabs: Use as directed by Anticoagualtion Clinic.  The target INR is 2.0-3.0.  The next INR is due 11/17/2009.  Anticoagulation instructions were given to patient.  Results were reviewed/authorized by Vashti Hey, RN.  She was notified by Vashti Hey RN.         Prior Anticoagulation Instructions: INR 3.6 Hold coumadin tonight then decrease dose to 1.5mg  once daily except 3mg  on Mondays  Current Anticoagulation Instructions: INR 3.2 Hold coumadin tonight then resume 1/2 tablet once daily except 1 tablet on Mondays

## 2010-05-05 NOTE — Letter (Signed)
Summary: Pacemaker implant card  Pacemaker implant card   Imported By: Jacklynn Ganong 06/02/2009 10:09:59  _____________________________________________________________________  External Attachment:    Type:   Image     Comment:   External Document

## 2010-05-05 NOTE — Procedures (Signed)
Summary: [facility]   Current Medications (verified): 1)  Nexium 40 Mg Cpdr (Esomeprazole Magnesium) .... Take 1 Tab Daily 2)  Eye Drops 0.05 % Soln (Tetrahydrozoline Hcl) .... Use As Directed 3)  Synthroid 125 Mcg Tabs (Levothyroxine Sodium) .... Take 1 Tab Daily 4)  Daily Multiple Vitamins  Tabs (Multiple Vitamin) .... Take 1 Tab Daily 5)  Vision Vitamins  Tabs (Multiple Vitamins-Minerals) .... Take 1 Tab Daily 6)  Calcium Carbonate 600 Mg Tabs (Calcium Carbonate) .... Take 1 Tab Daily 7)  Voltaren 1 % Gel (Diclofenac Sodium) .... Take As Directed 8)  Metoprolol Succinate 100 Mg Xr24h-Tab (Metoprolol Succinate) .... Take One Tablet By Mouth Daily 9)  Diltiazem Hcl Er Beads 120 Mg Xr24h-Cap (Diltiazem Hcl Er Beads) .... Take One Capsule By Mouth Daily  Allergies (verified): No Known Drug Allergies  PPM Specifications Following MD:  Lewayne Bunting, MD     PPM Vendor:  [employer]     PPM Model Number:  [id number]     PPM Serial Number:  [id number] PPM DOI:  05/12/2009     PPM Implanting MD:  Sherryl Manges, MD  Lead 1    Location: RA     DOI: 05/12/2009     Model #: 1942     Serial #: [id number]     Status: active Lead 2    Location: RV     DOI: 05/12/2009     Model #: 1946     Serial #: [id number]     Status: active  Magnet Response Rate:  BOL 85 ERI  65  Indications:  Sick sinus syndrome   PPM Follow Up Remote Check?  No Battery Voltage:  2.79 V     Battery Est. Longevity:  8.5 years     Pacer Dependent:  No       PPM Device Measurements Atrium  Amplitude: 2.0 mV, Impedance: 581 ohms, Threshold: 0.5 V at 0.4 msec Right Ventricle  Amplitude: 15.68 mV, Impedance: 950 ohms, Threshold: 0.75 V at 0.4 msec  Episodes MS Episodes:  42     Percent Mode Switch:  2.8%     Coumadin:  No Ventricular High Rate:  0     Atrial Pacing:  85.5%     Ventricular Pacing:  0.7%  Parameters Mode:  MVP (R)     Lower Rate Limit:  60     Upper Rate Limit:  130 Paced AV Delay:  150     Sensed AV Delay:  120 Next  Cardiology Appt Due:  08/04/2009 Tech Comments:  Wound check appt.  Steri-strips removed.  Wound without redness or edema.  Normal device function.  Pt in afib 2.8% of the time, not currently on anti-coagulation.  That issue was going to be addressed at follow-up.  Longest episode of afib was 1 hour 36 minutes.  V rates not well controlled during afib with mean v rate of 100bpm during this time.  No changes made today.  Pt will follow up with Dr Ladona Ridgel in [place] for pacer care.  Will forward this note to Dr Dietrich Pates (primary cardiologist), who pt has an appt with on 3-17 to help decide plan for anti-coagulation. Gypsy Balsam RN BSN  May 23, 2009 2:15 PM

## 2010-05-05 NOTE — Assessment & Plan Note (Signed)
Summary: **ec6 last seen 2006 abnormal ekg, AF   Visit Type:  Follow-up Referring Provider:  Dr Nobie Putnam  Primary Provider:  Dr Nobie Putnam    History of Present Illness: Wendy Garner is a very nice 43 CF who we are seeing at the request of Dr. Nobie Putnam after seeing her in his office for complaints of L knee pain.  An EKG was performed at that time that Dr. Nobie Putnam felt was Aflutter with a rate of 150bpm.  As a result he asked [place] to see her.  She has a history of a hospitalization in 2006 for SVT and Afib/flutter.  She was placed on Toprol XL 50mg  daily.  She says that she followed up with Dr. Dietrich Pates afterwards and was taken off of the Toprol. Unfortuately there is no chart available to confirm this.  She has been doing well since that appointment in 2006 and has had no need to return to cardiology, per patient.  She is unaware of heart heart rhythum or its rate.  She denies any symptoms associated with HR irregularity.  Preventive Screening-Counseling & Management  Alcohol-Tobacco     Alcohol drinks/day: 0     Smoking Status: never  Current Problems (verified): 1)  Hypertension  (ICD-401.9) 2)  Knee, Arthritis, Degen.Lanetta Inch  (ICD-715.96) 3)  Macular Degeneration  (ICD-362.50) 4)  Pud, Hx of  (ICD-V12.71) 5)  Arthritis, Hip  (ICD-716.95) 6)  Adenocarcinoma, Colon  (ICD-153.9) 7)  Atrial Fibrillation, Hx of  (ICD-V12.59)  Current Medications (verified): 1)  Nexium 40 Mg Cpdr (Esomeprazole Magnesium) .... Take 1 Tab Daily 2)  Eye Drops 0.05 % Soln (Tetrahydrozoline Hcl) .... Use As Directed 3)  Toprol Xl 50 Mg Xr24h-Tab (Metoprolol Succinate) .... Take 1 Tab Daily 4)  Synthroid 100 Mcg Tabs (Levothyroxine Sodium) .... Take 1 Tab Daily 5)  Daily Multiple Vitamins  Tabs (Multiple Vitamin) .... Take 1 Tab Daily 6)  Vision Vitamins  Tabs (Multiple Vitamins-Minerals) .... Take 1 Tab Daily 7)  Calcium Carbonate 600 Mg Tabs (Calcium Carbonate) .... Take 1 Tab Daily 8)  Voltaren 1 % Gel  (Diclofenac Sodium) .... Take As Directed  Allergies (verified): No Known Drug Allergies  Past History:  Past medical, surgical, family and social histories (including risk factors) reviewed, and no changes noted (except as noted below).  Past Medical History: Reviewed history from 04/16/2009 and no changes required. Current Problems:  MACULAR DEGENERATION (ICD-362.50) PUD, HX OF (ICD-V12.71) ARTHRITIS, HIP (ICD-716.95) ADENOCARCINOMA, COLON (ICD-153.9) ATRIAL FIBRILLATION, HX OF (ICD-V12.59)  Past Surgical History: Reviewed history from 04/17/2009 and no changes required. cataract surgery (bilaterial or which eye is not noted) cholecystectomy tonsillectomy Breast Thyroid Colon cancer  Family History: Reviewed history and no changes required.  Social History: Reviewed history and no changes required. Alcohol drinks/day:  0 Smoking Status:  never  Review of Systems  The patient denies anorexia, fever, weight loss, weight gain, vision loss, decreased hearing, hoarseness, chest pain, syncope, dyspnea on exertion, peripheral edema, prolonged cough, headaches, hemoptysis, abdominal pain, melena, hematochezia, severe indigestion/heartburn, hematuria, incontinence, genital sores, muscle weakness, suspicious skin lesions, transient blindness, difficulty walking, depression, unusual weight change, abnormal bleeding, enlarged lymph nodes, angioedema, breast masses, and testicular masses.         All other systems have been reviewed and are negative unless stated above.   Vital Signs:  Patient profile:   77 year old female Height:      68 inches Weight:      142 pounds Pulse rate:   58 /  minute BP sitting:   192 / 84  (right arm)  Vitals Entered By: Wendy Saa, CNA (April 17, 2009 2:09 PM)  Physical Exam  General:  Well developed, well nourished, in no acute distress. Head:  normocephalic and atraumatic Eyes:  PERRLA/EOM intact; conjunctiva and lids normal. Ears:   TM's intact and clear with normal canals and hearing Nose:  no deformity, discharge, inflammation, or lesions Mouth:  Teeth, gums and palate normal. Oral mucosa normal. Neck:  Neck supple, no JVD. No masses, thyromegaly or abnormal cervical nodes. Lungs:  Clear bilaterally to auscultation and percussion. Heart:  Irregular 1/6 systolic murmur Abdomen:  Bowel sounds positive; abdomen soft and non-tender without masses, organomegaly, or hernias noted. No hepatosplenomegaly. Msk:  Back normal, normal gait. Muscle strength and tone normal. Pulses:  pulses normal in all 4 extremities Extremities:  No clubbing or cyanosis. Neurologic:  Alert and oriented x 3. Skin:  Intact without lesions or rashes. Psych:  Normal affect.   EKG  Procedure date:  04/17/2009  Findings:      Normal sinus rhythm with rate of: 60bpm  PAC's noted.  Insignificant T-wave inversion noted:    V4,V6,  With flattening in the anterior leads.  Impression & Recommendations:  Problem # 1:  ABNORMAL ECHOCARDIOGRAM (ICD-793.2) The EKG from Dr. Geanie Logan office showed Aflutter,coarse Afib  with an elevated rate 140's.  Repeat EKG in our office after starting Toprol 50mg  is SR with PAC's.  With known history of SVT and Afib, will continue her Toprol.  Plan an echocardiogram and 21 day cardionet monitor to evaluate recurrances of irregular rhythm or tachy arrythmias.  As she is asymptomatic with this it will be important to see what is actually happening. At this time her CHADS - Vasc score is 2 (age, HTN).  Will defer need to place on Coumadin at this time.  Problem # 2:  HYPERTENSION (ICD-401.9) Her BP is not well controlled on this office visit.  Review of BP in PCP office is156/92.  We may need to add low dose ACE-, or Norvasc if necessary.  As Toprol has her HR at 60 bpm, would not increase BB at this time. Her updated medication list for this problem includes:    Toprol Xl 50 Mg Xr24h-tab (Metoprolol succinate) .Marland Kitchen... Take 1  tab daily  Orders: 2-D Echocardiogram (2D Echo) Cardionet/Event Monitor (Cardionet/Event)  Patient Instructions: 1)  Your physician recommends that you schedule a follow-up appointment in: 1 month with Dr. Dietrich Pates 2)  Your physician has requested that you have an echocardiogram.  Echocardiography is a painless test that uses sound waves to create images of your heart. It provides your doctor with information about the size and shape of your heart and how well your heart's chambers and valves are working.  This procedure takes approximately one hour. There are no restrictions for this procedure. 3)  Your physician has recommended that you wear an event monitor.  Event monitors are medical devices that record the heart's electrical activity. Doctors most often use these monitors to diagnose arrhythmias. Arrhythmias are problems with the speed or rhythm of the heartbeat. The monitor is a small, portable device. You can wear one while you do your normal daily activities. This is usually used to diagnose what is causing palpitations/syncope (passing out). Prescriptions: TOPROL XL 50 MG XR24H-TAB (METOPROLOL SUCCINATE) take 1 tab daily  #30 x 6   Entered by:   Larita Fife Via LPN   Authorized by:   Joni Reining, NP  Signed by:   Larita Fife Via LPN on 81/19/1478   Method used:   Electronically to        [facility] (retail)       [address]       [place], [place]  [address]       Ph: [phone] or [phone]       Fax: [phone]   RxID:   [id number]   Appended Document: **ec6 last seen 2006 abnormal ekg, AF Event Recorder shows AF with RVR-asymptomatic. Increase toprol to 100 mg once daily. Continue event monitoring.  Coulter Bing, M.D.

## 2010-05-05 NOTE — Assessment & Plan Note (Signed)
Summary: 1 mth fu per checkout on 04/17/09/tg   Visit Type:  Follow-up Primary Provider:  Dr Nobie Putnam    History of Present Illness: Return visit scheduled on an urgent basis for further assessment and treatment of sick sinus syndrome.  Wendy Garner has carried an event recorder for nearly 3 weeks.  Multiple episodes of atrial fibrillation with a rapid ventricular response have been recorded despite treatment with metoprolol, initially at a dose of 50 mg per day and more recently at 100 mg per day.  In recent days, bradycardia has occurred with both sinus bradycardia and junctional rhythms as low as 25 beats per minute, although duration has been brief, generally less than 15 secondsl.  Pauses of up to 4 seconds have also been documented.  Patient initially complained of malaise, fatigue, dyspnea on exertion and episodes of lightheadedness; however, with adjustment of her dose of levothyroxine, all of these problems have disappeared despite worsening of her arrhythmia.  Current Medications (verified): 1)  Nexium 40 Mg Cpdr (Esomeprazole Magnesium) .... Take 1 Tab Daily 2)  Eye Drops 0.05 % Soln (Tetrahydrozoline Hcl) .... Use As Directed 3)  Synthroid 125 Mcg Tabs (Levothyroxine Sodium) .... Take 1 Tab Daily 4)  Daily Multiple Vitamins  Tabs (Multiple Vitamin) .... Take 1 Tab Daily 5)  Vision Vitamins  Tabs (Multiple Vitamins-Minerals) .... Take 1 Tab Daily 6)  Calcium Carbonate 600 Mg Tabs (Calcium Carbonate) .... Take 1 Tab Daily 7)  Voltaren 1 % Gel (Diclofenac Sodium) .... Take As Directed  Allergies (verified): No Known Drug Allergies  Past History:  PMH, FH, and Social History reviewed and updated.  Past Medical History: Sick sinus syndrome: Paroxysmal atrial fibrillation with a rapid ventricular response;       extreme sinus bradycardia, junctional bradycardia and sinus arrest up to 4 seconds MACULAR DEGENERATION (ICD-362.50) Peptic ulcer disease with history of upper GI bleeding  in 2004 Degenerative joint disease-hip and lumbosacral spine ADENOCARCINOMA, COLON (ICD-153.9) ATRIAL FIBRILLATION, HX OF (ICD-V12.59)  Past Surgical History: Cataract extraction Cholecystectomy Tonsillectomy Partial colectomy for carcinoma of the colon-2002  Family History: Mother died at an advanced stage; cause unknown Father suffered a fatal CVA Siblings: 4 sisters, 3 of whom died with neoplastic disease  Social History: Employment:Retired from work with [employer] and resides in [place]; 2 adult children No history of tobacco use No history of significant alcohol use  Review of Systems  The patient denies weight loss, weight gain, vision loss, decreased hearing, hoarseness, chest pain, syncope, dyspnea on exertion, peripheral edema, prolonged cough, and headaches.         She has had no dizziness over the past week and certainly no syncope.  Weakness and malaise have resolved with an increased dose of levothyroxine.  Vital Signs:  Patient profile:   77 year old female Weight:      134 pounds Pulse rate:   69 / minute BP sitting:   152 / 74  (right arm)  Vitals Entered By: Dreama Saa, CNA (May 09, 2009 2:44 PM)  Physical Exam  General:    Well developed; no acute distress; proportionate height and weight Neck-No JVD; no carotid bruits: Lungs-normal respiratory rate; clear lung fields except for minimal right basilar rales. Cardiovascular-normal PMI; normal S1 and S2; modest basilar systolic ejection murmur Abdomen-BS normal; soft and non-tender without masses or organomegaly:  Musculoskeletal-No deformities, no cyanosis or clubbing: Neurologic-Normal cranial nerves; symmetric strength and tone:  Skin-Warm, no significant lesions: Extremities-Nl distal pulses; no edema:  Impression & Recommendations:  Problem # 1:  SICK SINUS SYNDROME (ICD-427.81) Moderate dose metoprolol has proved inadequate to control heart rate in atrial  fibrillation, but has exacerbated conduction system disease resulting in significant, although apparently asymptomatic, bradycardia.  Iimplantation of a dual-chamber pacemaker is warranted.  Wendy Garner will refrain from taking beta blocker for the time being, since atrial fibrillation with rapid rates does not seem to bother her, but her long pauses are worrisome.  We will arrange for pacemaker implantation as soon as possible.  AV nodal blocking agents can be resumed at that point and the dosage adjusted to achieve control of heart rate.  If she has any prolonged episodes of atrial fibrillation, anticoagulation will be necessary, but will be deferred for the time being.  I will see this nice woman again once her procedure has been completed next week.  Problem # 2:  HYPERTENSION (ICD-401.9) Blood pressure is mildly elevated.  Higher values are likely once metoprolol has been discontinued.  Additional agents can be started in hospital or will be added at subsequent office visits as needed.  Other Orders: T-Chest x-ray, 2 views ([address]) T-Basic Metabolic Panel [phone]) T-CBC w/Diff [phone]) T-PTT [phone]) T-Protime, Auto [phone])  Patient Instructions: 1)  Your physician recommends that you schedule a follow-up appointment in: AFTER PROCEDURE 2)  Your physician recommends that you return for lab work [id number] 3)  A chest x-ray takes a picture of the organs and structures inside the chest, including the heart, lungs, and blood vessels. This test can show several things, including, whether the heart is enlarged; whether fluid is building up in the lungs; and whether pacemaker / defibrillator leads are still in place. 4)  Your physician has recommended that you have a pacemaker inserted.  A pacemaker is a small device that is placed under the skin of your chest or abdomen to help control abnormal heart rhythms. This device uses electrical pulses to prompt the heart to beat at a  normal rate. Pacemakers are used to treat heart rhythms that are too slow. Wires (leads) are attached to the pacemaker that goes into the chambers of your heart. This is done in the hospital and usually requires an overnight stay. Please see the instruction sheet given to you today for more information. 5)  Your physician has recommended you make the following change in your medication: stop metoprolol

## 2010-05-05 NOTE — Medication Information (Signed)
Summary: CCR/DONT KNOW WHAT TIME SHE IS COMING/TMJ  Anticoagulant Therapy  Managed by: Vashti Hey, RN PCP: Dr Nobie Putnam  Supervising MD: Dietrich Pates MD, Darlyn Read Used: [facility] [place] Site: [place] INR POC 3.2  Dietary changes: no    Health status changes: no    Bleeding/hemorrhagic complications: no    Recent/future hospitalizations: yes       Details: scheduled for ablation on 09/22/09  Any changes in medication regimen? no    Recent/future dental: no  Any missed doses?: no       Is patient compliant with meds? yes       Allergies: No Known Drug Allergies  Anticoagulation Management History:      The patient is taking warfarin and comes in today for a routine follow up visit.  Positive risk factors for bleeding include an age of 75 years or older.  The bleeding index is 'intermediate risk'.  Positive CHADS2 values include History of HTN and Age > 11 years old.  Her last INR was 1.08.  Anticoagulation responsible provider: Dietrich Pates MD, Molly Maduro.  INR POC: 3.2.  Cuvette Lot#: [id number].    Anticoagulation Management Assessment/Plan:      The patient's current anticoagulation dose is Warfarin sodium 3 mg tabs: Use as directed by Anticoagualtion Clinic.  The target INR is 2.0-3.0.  The next INR is due 09/29/2009.  Anticoagulation instructions were given to patient.  Results were reviewed/authorized by Vashti Hey, RN.  She was notified by Vashti Hey RN.         Prior Anticoagulation Instructions: INR 3.7 Hold coumadin tonight then decrease coumadin to 3mg  qd except 1.5mg  on Mondays and Thursdays   Current Anticoagulation Instructions: INR 3.2 Take Coumadin 1/2 tablet tomorrow night then resume 1 tablet once daily except 1/2 tablet on Mondays and Thursdays

## 2010-05-05 NOTE — Letter (Signed)
Summary: ELectrophysiology/Ablation Procedure Instructions  Architectural technologist at [facility]  [address], [place] [address]   Phone: [phone]  Fax: [phone]     Electrophysiology/Ablation Procedure Instructions    You are scheduled for a(n) FLUTTER ABLATION on  09/22/2009 at  1:00PM  with Dr. Ladona Ridgel.  1.  Please come to the Short Stay Center at [facility] at 11:00AM on the day of your procedure.  2.  Come prepared to stay overnight.   Please bring your insurance cards and a list of your medications.    4.  Do not have anything to eat or drink after midnight the night before your procedure.  5.  TAKE ALL AM  medications for MORNING  before your procedure unless otherwise instructed:  ___________________.  All of your remaining medications may be taken with a small amount of water.  6.  Educational material received:  _____ EP   __X___ Ablation   * Occasionally, EP studies and ablations can become lengthy.  Please make your family aware of this before your procedure starts.  Average time ranges from 2-8 hours for EP studies/ablations.  Your physician will locate your family after the procedure with the results.  * If you have any questions after you get home, please call the office at [phone].

## 2010-05-05 NOTE — Letter (Signed)
Summary: [place] Results Engineer, agricultural at [facility]  [address] [place] [address]   Phone: [phone]  Fax: [phone]      May 12, 2009 MRN: [id number]   Wendy Garner [address] [place], [place]  [address]   Dear [name],  Your test ordered by Selena Batten has been reviewed by your physician (or physician assistant) and was found to be normal or stable. Your physician (or physician assistant) felt no changes were needed at this time.  ____ Echocardiogram  ____ Cardiac Stress Test  _X___ Lab Work  ____ Peripheral vascular study of arms, legs or neck  ____ CT scan or X-ray  ____ Lung or Breathing test  ____ Other: Please increase the potassium in your diet.  We will repeat labwork in 2 months. Enclosed is a copy of your labwork and foods high in potassium, per Dr. Dietrich Pates.  Thank you, Wendy Garner Allyne Gee RN    Nash Bing, MD, Lenise Arena.C.Gaylord Shih, MD, F.A.C.C Lewayne Bunting, MD, F.A.C.C Nona Dell, MD, F.A.C.C Charlton Haws, MD, Lenise Arena.C.C

## 2010-05-05 NOTE — Miscellaneous (Signed)
Summary: Device preload  Clinical Lists Changes  Observations: Added new observation of PPM INDICATN: Sick sinus syndrome (05/13/2009 14:56) Added new observation of MAGNET RTE: BOL 85 ERI  65 (05/13/2009 14:56) Added new observation of PPMLEADSTAT2: active (05/13/2009 14:56) Added new observation of PPMLEADSER2: [id number] (05/13/2009 14:56) Added new observation of PPMLEADMOD2: 1946  (05/13/2009 14:56) Added new observation of PPMLEADLOC2: RV  (05/13/2009 14:56) Added new observation of PPMLEADSTAT1: active  (05/13/2009 14:56) Added new observation of PPMLEADSER1: [id number]  (05/13/2009 14:56) Added new observation of PPMLEADMOD1: 1942  (05/13/2009 14:56) Added new observation of PPMLEADLOC1: RA  (05/13/2009 14:56) Added new observation of PPM IMP MD: Sherryl Manges, MD  (05/13/2009 14:56) Added new observation of PPMLEADDOI2: 05/12/2009  (05/13/2009 14:56) Added new observation of PPMLEADDOI1: 05/12/2009  (05/13/2009 14:56) Added new observation of PPM DOI: 05/12/2009  (05/13/2009 14:56) Added new observation of PPM SERL#: [id number]  (05/13/2009 14:56) Added new observation of PPM MODL#: [id number]  (05/13/2009 08:65) Added new observation of PACEMAKERMFG: Medtronic  (05/13/2009 14:56) Added new observation of PACEMAKER MD: Sherryl Manges, MD  (05/13/2009 14:56)      PPM Specifications Following MD:  Sherryl Manges, MD     PPM Vendor:  Medtronic     PPM Model Number:  ADDRL1     PPM Serial Number:  [id number] PPM DOI:  05/12/2009     PPM Implanting MD:  Sherryl Manges, MD  Lead 1    Location: RA     DOI: 05/12/2009     Model #: 1942     Serial #: [id number]     Status: active Lead 2    Location: RV     DOI: 05/12/2009     Model #: 1946     Serial #: [id number]     Status: active  Magnet Response Rate:  BOL 85 ERI  65  Indications:  Sick sinus syndrome

## 2010-05-05 NOTE — Medication Information (Signed)
Summary: CCR  Anticoagulant Therapy  Managed by: Vashti Hey, RN PCP: Dr Nobie Putnam  Supervising MD: Dietrich Pates MD, Darlyn Read Used: [facility] [place] Site: [place] INR POC 3.0  Dietary changes: no    Health status changes: yes       Details: Had clot on TEE  Bleeding/hemorrhagic complications: no    Recent/future hospitalizations: yes       Details: scheduled for flutter ablation 09/22/09  Any changes in medication regimen? no    Recent/future dental: no  Any missed doses?: no       Is patient compliant with meds? yes       Allergies: No Known Drug Allergies  Anticoagulation Management History:      The patient comes in today for her initial visit for anticoagulation therapy.  Positive risk factors for bleeding include an age of 23 years or older.  The bleeding index is 'intermediate risk'.  Positive CHADS2 values include History of HTN and Age > 76 years old.  Her last INR was 1.08.  Anticoagulation responsible provider: Dietrich Pates MD, Molly Maduro.  INR POC: 3.0.  Cuvette Lot#: [id number].    Anticoagulation Management Assessment/Plan:      The patient's current anticoagulation dose is Warfarin sodium 3 mg tabs: Use as directed by Anticoagualtion Clinic.  The target INR is 2.0-3.0.  The next INR is due 09/15/2009.  Anticoagulation instructions were given to patient.  Results were reviewed/authorized by Vashti Hey, RN.  She was notified by Vashti Hey RN.        Coagulation management information includes: Scheduled for ablation 09/22/09.  Current Anticoagulation Instructions: INR 3.0 States INR was 2.03 on 09/08/09 Take coumadin 3mg  once daily except 1.5mg  on Friday.  Recheck INR on 09/15/09

## 2010-05-05 NOTE — Medication Information (Signed)
Summary: ccr-lr  Anticoagulant Therapy  Managed by: Vashti Hey, RN PCP: Robbie Lis  Supervising MD: Dietrich Pates MD, Darlyn Read Used: [facility] [place] Site: [place] INR POC 3.4  Dietary changes: no    Health status changes: no    Bleeding/hemorrhagic complications: no    Recent/future hospitalizations: no    Any changes in medication regimen? no    Recent/future dental: no  Any missed doses?: no       Is patient compliant with meds? yes       Allergies: No Known Drug Allergies  Anticoagulation Management History:      The patient is taking warfarin and comes in today for a routine follow up visit.  Positive risk factors for bleeding include an age of 75 years or older.  The bleeding index is 'intermediate risk'.  Positive CHADS2 values include History of HTN and Age > 75 years old.  Her last INR was 2.91.  Anticoagulation responsible provider: Dietrich Pates MD, Molly Maduro.  INR POC: 3.4.  Cuvette Lot#: [id number].    Anticoagulation Management Assessment/Plan:      The patient's current anticoagulation dose is Warfarin sodium 3 mg tabs: Use as directed by Anticoagualtion Clinic.  The target INR is 2.0-3.0.  The next INR is due 12/31/2009.  Anticoagulation instructions were given to patient.  Results were reviewed/authorized by Vashti Hey, RN.  She was notified by Vashti Hey RN.         Prior Anticoagulation Instructions: INR 2.8 Continue coumadin 1.5mg  once daily except 3mg  on Mondays  Current Anticoagulation Instructions: INR 3.4 Decrease coumadin to 1.50mg  once daily

## 2010-05-05 NOTE — Assessment & Plan Note (Signed)
Summary: rov   Visit Type:  Follow-up Primary Provider:  Dr Nobie Putnam    History of Present Illness: [name] returns to the office following an uncomplicated dual-chamber pacemaker implantation procedure at [facility] by Dr. Graciela Husbands.  She has subsequently been asymptomatic with resolution of previously noted orthostatic dizziness and episodic palpitations.  On initial return EP visits, only minimal atrial arrhythmias have been identified by interrogation of her device.  Patient had previously been evaluated in 2006 for what was characterized as PSVT.  Her arrhythmia was controlled with beta blocker, but she was subsequently lost to followup.  Current Medications (verified): 1)  Nexium 40 Mg Cpdr (Esomeprazole Magnesium) .... Take 1 Tab Daily 2)  Eye Drops 0.05 % Soln (Tetrahydrozoline Hcl) .... Use As Directed 3)  Synthroid 125 Mcg Tabs (Levothyroxine Sodium) .... Take 1 Tab Daily 4)  Daily Multiple Vitamins  Tabs (Multiple Vitamin) .... Take 1 Tab Daily 5)  Vision Vitamins  Tabs (Multiple Vitamins-Minerals) .... Take 1 Tab Daily 6)  Calcium Carbonate 600 Mg Tabs (Calcium Carbonate) .... Take 1 Tab Daily 7)  Metoprolol Succinate 100 Mg Xr24h-Tab (Metoprolol Succinate) .... Take One Tablet By Mouth Daily 8)  Diltiazem Hcl Er Beads 120 Mg Xr24h-Cap (Diltiazem Hcl Er Beads) .... Take One Capsule By Mouth Daily  Allergies (verified): No Known Drug Allergies  Past History:  PMH, FH, and Social History reviewed and updated.  Review of Systems  The patient denies weight loss, weight gain, vision loss, decreased hearing, hoarseness, chest pain, syncope, dyspnea on exertion, peripheral edema, prolonged cough, headaches, hemoptysis, abdominal pain, melena, and hematochezia.    Vital Signs:  Patient profile:   77 year old female Weight:      134 pounds Pulse rate:   96 / minute BP sitting:   147 / 74  (right arm)  Vitals Entered By: Dreama Saa, CNA (June 19, 2009  2:17 PM)  Physical Exam  General:    Well developed; no acute distress; proportionate height and weight Neck-No JVD; no carotid bruits: Lungs-normal respiratory rate; clear lung fields except for minimal left basilar rales. Thorax-pacing generator implanted in the left infraclavicular region; no edema or hematoma; well-healed incision. Cardiovascular-normal PMI; normal S1 and S2; modest basilar systolic ejection murmur Abdomen-BS normal; soft and non-tender without masses or organomegaly:  Musculoskeletal-No deformities, no cyanosis or clubbing: Neurologic-Normal cranial nerves; symmetric strength and tone:  Skin-Warm, no significant lesions: Extremities-Nl distal pulses; no edema:     PPM Specifications Following MD:  Lewayne Bunting, MD     PPM Vendor:  [employer]tronic     PPM Model Number:  ADDRL1     PPM Serial Number:  [id number] PPM DOI:  05/12/2009     PPM Implanting MD:  Sherryl Manges, MD  Lead 1    Location: RA     DOI: 05/12/2009     Model #: 1942     Serial #: [id number]     Status: active Lead 2    Location: RV     DOI: 05/12/2009     Model #: 1946     Serial #: [id number]     Status: active  Magnet Response Rate:  BOL 85 ERI  65  Indications:  Sick sinus syndrome   PPM Follow Up Pacer Dependent:  No      Episodes Coumadin:  No  Parameters Mode:  MVP (R)     Lower Rate Limit:  60     Upper Rate Limit:  130 Paced  AV Delay:  150     Sensed AV Delay:  120  Impression & Recommendations:  Problem # 1:  SICK SINUS SYNDROME (ICD-427.81) Bradyarrhythmias have been controlled, as expected, with pacing.  Symptoms related to tachyarrhythmias have been controlled with medication.  Although she is experiencing some atrial fibrillation, episodes are very brief period.  Her risk for anticoagulation is increased both due to age and her history of peptic ulcer disease.  At this point, the risk of full anticoagulation certainly exceeds the potential benefit.  I will continue to monitor  results of device interrogations and modify this plan, if necessary.  An appointment for appropriate EP F/U has been verified.  I will plan to see this nice woman again in 6 months.  Problem # 2:  HYPERTENSION (ICD-401.9) Blood pressure control is excellent.  Current medications will be continued.  Other Orders: Durable Medical Equipment (DME)  Patient Instructions: 1)  Your physician recommends that you schedule a follow-up appointment in: 6 MONTHS 2)  Your physician has requested that you regularly monitor and record your blood pressure readings at home.  Please use the same machine at the same time of day to check your readings and record them to bring to your follow-up visit. 3)  You have been referred to NURSE VISIT IN 1 MONTH BRING YOUR BLOOD PRESSURE DIARY WITH YOU

## 2010-05-05 NOTE — Letter (Signed)
Summary: Appointment - Reminder 2  [facility] [place]. [address], [place] [address]   Phone: [phone]  Fax: [phone]     March 12, 2010 MRN: [id number]   Wendy Garner [address] [place], [place]  [address]   Dear [name],  Our records indicate that it is time to schedule a follow-up appointment.  Dr.  Dietrich Pates        recommended that you follow up with [place] in     12/2009 [PAST DUE       . It is very important that we reach you to schedule this appointment. We look forward to participating in your health care needs. Please contact us at the number listed above at your earliest convenience to schedule your appointment.  If you are unable to make an appointment at this time, give [place] a call so we can update our records.     Sincerely,   Glass blower/designer

## 2010-05-05 NOTE — Letter (Signed)
Summary: [place] Results Engineer, agricultural at [facility]  [address], [place] [address]   Phone: [phone]  Fax: [phone]      April 28, 2009 MRN: [id number]   Wendy Garner [address] [place], [place]  [address]   Dear [name],  Your test ordered by Selena Batten has been reviewed by your physician (or physician assistant) and was found to be normal or stable. Your physician (or physician assistant) felt no changes were needed at this time.  __X__ Echocardiogram  ____ Cardiac Stress Test  ____ Lab Work  ____ Peripheral vascular study of arms, legs or neck  ____ CT scan or X-ray  ____ Lung or Breathing test  ____ Other: Please continue on current medical treatment.  Thank you.   Doddridge Bing, MD, F.A.C.C

## 2010-05-05 NOTE — Medication Information (Signed)
Summary: ccr-lr  Anticoagulant Therapy  Managed by: Vashti Hey, RN PCP: Robbie Lis  Supervising MD: Dietrich Pates MD, Darlyn Read Used: [facility] [place] Site: [place] INR POC 3.6  Dietary changes: no    Health status changes: no    Bleeding/hemorrhagic complications: no    Recent/future hospitalizations: no    Any changes in medication regimen? no    Recent/future dental: no  Any missed doses?: no       Is patient compliant with meds? yes       Allergies: No Known Drug Allergies  Anticoagulation Management History:      The patient is taking warfarin and comes in today for a routine follow up visit.  Positive risk factors for bleeding include an age of 75 years or older.  The bleeding index is 'intermediate risk'.  Positive CHADS2 values include History of HTN and Age > 75 years old.  Her last INR was 2.91.  Anticoagulation responsible provider: Dietrich Pates MD, Molly Maduro.  INR POC: 3.6.    Anticoagulation Management Assessment/Plan:      The patient's current anticoagulation dose is Warfarin sodium 3 mg tabs: Use as directed by Anticoagualtion Clinic.  The target INR is 2.0-3.0.  The next INR is due 03/12/2010.  Anticoagulation instructions were given to patient.  Results were reviewed/authorized by Vashti Hey, RN.  She was notified by Vashti Hey RN.         Prior Anticoagulation Instructions: INR 2.3 Continue coumadin 1/2 tablet once daily except none on Saturdays  Current Anticoagulation Instructions: INR 3.6 Decrease coumadin to 1/2 tablet once daily except none on Mondays and Fridays

## 2010-05-05 NOTE — Medication Information (Signed)
Summary: ccr-lr  Anticoagulant Therapy  Managed by: Vashti Hey, RN PCP: Robbie Lis  Supervising MD: Dietrich Pates MD, Darlyn Read Used: [facility] [place] Site: [place] INR POC 2.7  Dietary changes: no    Health status changes: no    Bleeding/hemorrhagic complications: no    Recent/future hospitalizations: no    Any changes in medication regimen? yes       Details: Increased to Amiodarone to 1 1/2 tablets qd   Recent/future dental: no  Any missed doses?: no       Is patient compliant with meds? yes       Allergies: No Known Drug Allergies  Anticoagulation Management History:      The patient is taking warfarin and comes in today for a routine follow up visit.  Positive risk factors for bleeding include an age of 75 years or older.  The bleeding index is 'intermediate risk'.  Positive CHADS2 values include History of HTN and Age > 60 years old.  Her last INR was 2.91.  Anticoagulation responsible provider: Dietrich Pates MD, Molly Maduro.  INR POC: 2.7.  Cuvette Lot#: [id number].    Anticoagulation Management Assessment/Plan:      The patient's current anticoagulation dose is Warfarin sodium 3 mg tabs: Use as directed by Anticoagualtion Clinic.  The target INR is 2.0-3.0.  The next INR is due 01/21/2010.  Anticoagulation instructions were given to patient.  Results were reviewed/authorized by Vashti Hey, RN.  She was notified by Vashti Hey RN.        Coagulation management information includes: Scheduled for ablation 09/22/09   Ablation was cancelled and DCCV performed 09/22/09  12/10/09 Amiodarone increased to 300mg  once daily .  Prior Anticoagulation Instructions: INR 3.4 Decrease coumadin to 1.50mg  once daily   Current Anticoagulation Instructions: INR 2.7 Pt has been taking 1.5mg  once daily except 3mg  on Mondays Continue coumadin 1.5mg  once daily except 3mg  on mondays

## 2010-05-05 NOTE — Cardiovascular Report (Signed)
Summary: Office Visit   Office Visit   Imported By: Roderic Ovens 06/04/2009 16:48:58  _____________________________________________________________________  External Attachment:    Type:   Image     Comment:   External Document

## 2010-05-05 NOTE — Progress Notes (Signed)
Summary: reaction to meds   Phone Note Call from Patient   Caller: Patient Reason for Call: Talk to Nurse Summary of Call: s:patient thinks she is having a reaction to diltiazem and metoprolol, having bad pain in bottom of stomach and dry mouth dob:12-05-1915 call back number is [phone] real important that she talk with tammy sanders. Initial call taken by: Dreama Saa, CNA,  July 16, 2009 8:08 AM  Follow-up for Phone Call        B:pacemaker 05/12/09, pt started on metoprolol 100mg  once daily  and diltiazem 120mg  once daily at that time A:  pt scheduled for a nurse visit 4/18, c/o severe pain in bottom of abdomen,dry mouth, and ocas. sbp 10w 100's, no other s/s of uti  R:  asked pt to eat a bland diet and take and extra nexium for a few days and see if that help and bring all her information and bp diary to her nurse visit. Follow-up by: Teressa Lower RN,  July 16, 2009 9:57 AM  Additional Follow-up for Phone Call Additional follow up Details #1::        Good.  Abdominal pain is not an adverse effect of any of her medications.  If symptoms persist, she should contact her primary care physician.

## 2010-05-05 NOTE — Letter (Signed)
Summary: EKG  EKG   Imported By: Faythe Ghee 04/18/2009 08:56:27  _____________________________________________________________________  External Attachment:    Type:   Image     Comment:   External Document

## 2010-05-05 NOTE — Medication Information (Signed)
Summary: ccr-at Ladona Ridgel appt-lr  Anticoagulant Therapy  Managed by: Vashti Hey, RN PCP: Dr Nobie Putnam  Supervising MD: Dietrich Pates MD, Darlyn Read Used: [facility] [place] Site: [place] INR POC 3.9  Dietary changes: no    Health status changes: no    Bleeding/hemorrhagic complications: no    Recent/future hospitalizations: no    Any changes in medication regimen? no    Recent/future dental: no  Any missed doses?: no       Is patient compliant with meds? yes       Allergies: No Known Drug Allergies  Anticoagulation Management History:      The patient is taking warfarin and comes in today for a routine follow up visit.  Positive risk factors for bleeding include an age of 45 years or older.  The bleeding index is 'intermediate risk'.  Positive CHADS2 values include History of HTN and Age > 53 years old.  Her last INR was 2.91.  Anticoagulation responsible provider: Dietrich Pates MD, Molly Maduro.  INR POC: 3.9.  Cuvette Lot#: [id number].    Anticoagulation Management Assessment/Plan:      The patient's current anticoagulation dose is Warfarin sodium 3 mg tabs: Use as directed by Anticoagualtion Clinic.  The target INR is 2.0-3.0.  The next INR is due 10/09/2009.  Anticoagulation instructions were given to patient.  Results were reviewed/authorized by Vashti Hey, RN.  She was notified by Vashti Hey RN.         Prior Anticoagulation Instructions: INR 5.2 Pt was sent home from hospital 6/22 on coumadin 3mg  once daily  Pt took coumadin this morning Hold coumadin tomorrow and recheck 10/01/09 at Dr Ladona Ridgel appt.  Current Anticoagulation Instructions: INR 3.9 Hold coumadin tonight then decrease coumadin to 3mg  once daily except 1.5mg  on Tuesdays, Thursdays and Saturdays

## 2010-05-05 NOTE — Medication Information (Signed)
Summary: ccr-lr  Anticoagulant Therapy  Managed by: Vashti Hey, RN PCP: Dr Nobie Putnam  Supervising MD: Daleen Squibb MD, Darene Lamer Used: [facility] [place] Site: [place] INR POC 2.8  Dietary changes: no    Health status changes: no    Bleeding/hemorrhagic complications: no    Recent/future hospitalizations: no    Any changes in medication regimen? no    Recent/future dental: no  Any missed doses?: no       Is patient compliant with meds? yes       Allergies: No Known Drug Allergies  Anticoagulation Management History:      The patient is taking warfarin and comes in today for a routine follow up visit.  Positive risk factors for bleeding include an age of 75 years or older.  The bleeding index is 'intermediate risk'.  Positive CHADS2 values include History of HTN and Age > 49 years old.  Her last INR was 2.91.  Anticoagulation responsible provider: Daleen Squibb MD, Maisie Fus.  INR POC: 2.8.  Cuvette Lot#: [id number].    Anticoagulation Management Assessment/Plan:      The patient's current anticoagulation dose is Warfarin sodium 3 mg tabs: Use as directed by Anticoagualtion Clinic.  The target INR is 2.0-3.0.  The next INR is due 12/10/2009.  Anticoagulation instructions were given to patient.  Results were reviewed/authorized by Vashti Hey, RN.  She was notified by Vashti Hey RN.         Prior Anticoagulation Instructions: INR 3.2 Hold coumadin tonight then resume 1/2 tablet once daily except 1 tablet on Mondays  Current Anticoagulation Instructions: INR 2.8 Continue coumadin 1.5mg  once daily except 3mg  on Mondays Prescriptions: WARFARIN SODIUM 3 MG TABS (WARFARIN SODIUM) Use as directed by Anticoagualtion Clinic  #30 x 3   Entered by:   Vashti Hey RN   Authorized by:   Laren Boom, MD, [facility]   Signed by:   Vashti Hey RN on 11/17/2009   Method used:   Electronically to        [facility] (retail)       [address]       [place], [place]  [address]       Ph: [phone] or [phone]       Fax: [phone]   RxID:   [id number]

## 2010-05-05 NOTE — Progress Notes (Signed)
Summary: Pt having problems  Phone Note Call from Patient   Caller: Spouse [place] [facility]) Reason for Call: Talk to Nurse Summary of Call: Pt's daughter states that pt is still having problems with heart rate / pls return phone call/tg Initial call taken by: Raechel Ache [facility],  December 29, 2009 10:59 AM  Follow-up for Phone Call        pt takes amiodorone 1 1/2 tablets daily today her hr is 49, 140/76, pt is c/o dizziness upon standing and while sitting it is occurring about every 15-30 minutes. her late hr and bp were 51, 156/80 Follow-up by: Teressa Lower RN,  December 29, 2009 1:03 PM  Additional Follow-up for Phone Call Additional follow up Details #1::        Pt came into the office today for a ccr visit, no further dizziness since Monday, discovered today that pt ate a sausage biscuit from [employer] on Sunday for dinner. This may have contributed to the symptoms of Monday. Additional Follow-up by: Teressa Lower RN,  December 31, 2009 2:44 PM    New/Updated Medications: AMIODARONE HCL 200 MG TABS (AMIODARONE HCL) take 1 1/2 tablets daily

## 2010-05-05 NOTE — Letter (Signed)
Summary: ELectrophysiology/Ablation Procedure Instructions  [facility] at [place]  [address] [place] [address]   Phone: [phone]  Fax: [phone]    Transesophageal Echocardiogram and  Electrophysiology/Ablation Procedure Instructions    You are scheduled for a  TEE and flutter ablation on September 04, 2009 at 10:00 am with Dr. Shirlee Latch and Dr. Ladona Ridgel.  1.  Please come to the Short Stay Center at [facility] at 8:00 am on the day of your procedure.  2.  Come prepared to stay overnight.   Please bring your insurance cards and a list of your medications.    4.  Do not have anything to eat or drink after midnight the night before your procedure.  5.  Do NOT any medications the morning of your procedure unless otherwise instructed:    6.  Educational material received:  _____ EP   ___x__ Ablation   * Occasionally, EP studies and ablations can become lengthy.  Please make your family aware of this before your procedure starts.  Average time ranges from 2-8 hours for EP studies/ablations.  Your physician will locate your family after the procedure with the results.  * If you have any questions after you get home, please call the office at [phone].  Appended Document: ELectrophysiology/Ablation Procedure Instructions Procedure is on the 6th per Frimy Uffelman is RDS

## 2010-05-05 NOTE — Procedures (Signed)
Summary: Holter and Event  Holter and Event   Imported By: Faythe Ghee 05/19/2009 11:54:55  _____________________________________________________________________  External Attachment:    Type:   Image     Comment:   External Document

## 2010-05-05 NOTE — Assessment & Plan Note (Signed)
Summary: 3 month rov   Visit Type:  Follow-up Referring Provider:  Dr Nobie Putnam  Primary Provider:  [facility]   CC:  no cardiology complaints.  History of Present Illness: Wendy Garner returns today for followup of her atrial flutter and bradycardia and HTN.  She is a pleasant elderly woman with problems as above.  She underwent EPS several months ago and was found to have a left atrial flutter for which she was cardioverted and started on amiodarone.  She returns for followup.  No palpitations.  She still has mild dyspnea.  Her blood pressure has been elevated and she admits to dietary indiscretion with sodium.  Current Medications (verified): 1)  Nexium 40 Mg Cpdr (Esomeprazole Magnesium) .... Take 1 Tab Daily 2)  Eye Drops 0.05 % Soln (Tetrahydrozoline Hcl) .... Use As Directed 3)  Synthroid 125 Mcg Tabs (Levothyroxine Sodium) .... Take 1 Tab Daily 4)  Daily Multiple Vitamins  Tabs (Multiple Vitamin) .... Take 1 Tab Daily 5)  Vision Vitamins  Tabs (Multiple Vitamins-Minerals) .... Take 1 Tab Daily 6)  Calcium Carbonate 600 Mg Tabs (Calcium Carbonate) .... Take 1 Tab Daily 7)  Toprol Xl 50 Mg Xr24h-Tab (Metoprolol Succinate) .... Take 1 Tab Daily 8)  Warfarin Sodium 3 Mg Tabs (Warfarin Sodium) .... Use As Directed By Anticoagualtion Clinic 9)  Amiodarone Hcl 200 Mg Tabs (Amiodarone Hcl) .... Take One Tablet By Mouth Twice A Day X 3 Weeks Then 1 Tablet Once Daily  Allergies (verified): No Known Drug Allergies  Past History:  Past Medical History: Last updated: 05/09/2009 Sick sinus syndrome: Paroxysmal atrial fibrillation with a rapid ventricular response;       extreme sinus bradycardia, junctional bradycardia and sinus arrest up to 4 seconds MACULAR DEGENERATION (ICD-362.50) Peptic ulcer disease with history of upper GI bleeding in 2004 Degenerative joint disease-hip and lumbosacral spine ADENOCARCINOMA, COLON (ICD-153.9) ATRIAL FIBRILLATION, HX OF (ICD-V12.59)  Past Surgical  History: Last updated: 05/09/2009 Cataract extraction Cholecystectomy Tonsillectomy Partial colectomy for carcinoma of the colon-2002  Review of Systems  The patient denies chest pain, syncope, dyspnea on exertion, and peripheral edema.    Vital Signs:  Patient profile:   77 year old female Weight:      133 pounds Pulse rate:   127 / minute BP sitting:   159 / 98  (right arm)  Vitals Entered By: Dreama Saa, CNA (December 04, 2009 9:02 AM)  Physical Exam  General:    Well developed; no acute distress; proportionate height and weight Neck-No JVD; no carotid bruits: Lungs-normal respiratory rate; clear lung fields except for minimal left basilar rales. Thorax-pacing generator implanted in the left infraclavicular region; no edema or hematoma; well-healed incision. Cardiovascular-normal PMI; normal S1 and S2; modest basilar systolic ejection murmur Abdomen-BS normal; soft and non-tender without masses or organomegaly:  Musculoskeletal-No deformities, no cyanosis or clubbing: Neurologic-Normal cranial nerves; symmetric strength and tone:  Skin-Warm, no significant lesions: Extremities-Nl distal pulses; no edema:     PPM Specifications Following MD:  Lewayne Bunting, MD     PPM Vendor:  [employer]     PPM Model Number:  [id number]     PPM Serial Number:  [id number] PPM DOI:  05/12/2009     PPM Implanting MD:  Sherryl Manges, MD  Lead 1    Location: RA     DOI: 05/12/2009     Model #: 1942     Serial #: [id number]     Status: active Lead 2    Location: RV  DOI: 05/12/2009     Model #: 1948     Serial #: [id number]     Status: active  Magnet Response Rate:  BOL 85 ERI  65  Indications:  Sick sinus syndrome   PPM Follow Up Remote Check?  No Battery Voltage:  2.79 V     Battery Est. Longevity:  12.5 years     Pacer Dependent:  No       PPM Device Measurements Atrium  Amplitude: 1.0 mV, Impedance: 567 ohms,  Right Ventricle  Amplitude: 11.2 mV, Impedance: 935 ohms, Threshold:  0.625 V at 0.4 msec  Episodes Coumadin:  Yes Atrial Pacing:  98.7%      Parameters Mode:  DDD     Lower Rate Limit:  50     Upper Rate Limit:  130 Paced AV Delay:  300     Sensed AV Delay:  280 Tech Comments:  Atrial undersensing both unipolar and bipolar.  A-flutter with RVR today.  Burst pace attempted without success by Dr. Ladona Ridgel.  Atrial sensitivity reprogrammed 0.77mV.  MD Comments:  Agree with above. Tried to pace out of her atrial tachy and into atrial fib./flutter.  Impression & Recommendations:  Problem # 1:  CARDIAC PACEMAKER IN SITU (ICD-V45.01) Her device is working normally.  Will recheck in several months.  Problem # 2:  HYPERTENSION (ICD-401.9) Her blood pressure is well controlled.She will continue her current meds. Her updated medication list for this problem includes:    Toprol Xl 50 Mg Xr24h-tab (Metoprolol succinate) .Marland Kitchen... Take 1 tab daily  Problem # 3:  ATRIAL FLUTTER (ICD-427.32) She has mostly maintained NSR. She is out of rhythm today.  Continue meds as below. Her updated medication list for this problem includes:    Toprol Xl 50 Mg Xr24h-tab (Metoprolol succinate) .Marland Kitchen... Take 1 tab daily    Warfarin Sodium 3 Mg Tabs (Warfarin sodium) ..... Use as directed by anticoagualtion clinic    Amiodarone Hcl 200 Mg Tabs (Amiodarone hcl) .Marland Kitchen... Take one tablet by mouth twice a day x 3 weeks then 1 tablet once daily  Patient Instructions: 1)  Your physician recommends that you schedule a follow-up appointment in: 4 months 2)  Your physician recommends that you continue on your current medications as directed. Please refer to the Current Medication list given to you today.

## 2010-05-05 NOTE — Letter (Signed)
Summary: History form  History form   Imported By: Jacklynn Ganong 04/21/2009 11:53:09  _____________________________________________________________________  External Attachment:    Type:   Image     Comment:   External Document

## 2010-05-05 NOTE — Medication Information (Signed)
Summary: ccr-lr  Anticoagulant Therapy  Managed by: Vashti Hey, RN PCP: Dr Judene Companion MD: Diona Browner MD, Alexander Mt Used: [facility] [place] Site: [place] INR POC 4.4  Dietary changes: no    Health status changes: no    Bleeding/hemorrhagic complications: no    Recent/future hospitalizations: no    Any changes in medication regimen? no    Recent/future dental: no  Any missed doses?: no       Is patient compliant with meds? yes       Allergies: No Known Drug Allergies  Anticoagulation Management History:      The patient is taking warfarin and comes in today for a routine follow up visit.  Positive risk factors for bleeding include an age of 75 years or older.  The bleeding index is 'intermediate risk'.  Positive CHADS2 values include History of HTN and Age > 75 years old.  Her last INR was 2.91.  Anticoagulation responsible provider: Diona Browner MD, Remi Deter.  INR POC: 4.4.    Anticoagulation Management Assessment/Plan:      The patient's current anticoagulation dose is Warfarin sodium 3 mg tabs: Use as directed by Anticoagualtion Clinic.  The target INR is 2.0-3.0.  The next INR is due 10/15/2009.  Anticoagulation instructions were given to patient.  Results were reviewed/authorized by Vashti Hey, RN.  She was notified by Vashti Hey RN.         Prior Anticoagulation Instructions: INR 3.9 Hold coumadin tonight then decrease coumadin to 3mg  once daily except 1.5mg  on Tuesdays, Thursdays and Saturdays  Current Anticoagulation Instructions: INR 4.4 Hold coumadin tonight and tomorrow night then decrease dose to 1.5mg  once daily except 3mg  on Mondays and Fridays

## 2010-05-05 NOTE — Letter (Signed)
Summary: Handout Printed  Printed Handout:  - Diet - Sodium-Controlled 

## 2010-05-05 NOTE — Letter (Signed)
Summary: External Correspondence  External Correspondence   Imported By: Faythe Ghee 04/18/2009 08:55:54  _____________________________________________________________________  External Attachment:    Type:   Image     Comment:   External Document

## 2010-05-05 NOTE — Miscellaneous (Signed)
Summary: toprol refill  Clinical Lists Changes  Medications: Changed medication from TOPROL XL 50 MG XR24H-TAB (METOPROLOL SUCCINATE) take 1 tab daily to METOPROLOL SUCCINATE 100 MG XR24H-TAB (METOPROLOL SUCCINATE) Take one tablet by mouth daily - Signed Rx of METOPROLOL SUCCINATE 100 MG XR24H-TAB (METOPROLOL SUCCINATE) Take one tablet by mouth daily;  #30 x 6;  Signed;  Entered by: Teressa Lower RN;  Authorized by: Kathlen Brunswick, MD, [facility];  Method used: Electronically to [facility] [address], [place], [place], [place]  [address], Ph: [phone] or [phone], Fax: [phone] Rx of METOPROLOL SUCCINATE 100 MG XR24H-TAB (METOPROLOL SUCCINATE) Take one tablet by mouth daily;  #30 x 6;  Signed;  Entered by: Teressa Lower RN;  Authorized by: Kathlen Brunswick, MD, [facility];  Method used: Electronically to [facility] [address] [place], [place], [place]  [address], Ph: [phone] or [phone], Fax: [phone]    Prescriptions: METOPROLOL SUCCINATE 100 MG XR24H-TAB (METOPROLOL SUCCINATE) Take one tablet by mouth daily  #30 x 6   Entered by:   Teressa Lower RN   Authorized by:   Kathlen Brunswick, MD, [facility]   Signed by:   Teressa Lower RN on 04/25/2009   Method used:   Electronically to        [facility] (retail)       [address]       [place], [place]  [address]       Ph: [phone] or [phone]       Fax: [phone]   RxID:   [id number] METOPROLOL SUCCINATE 100 MG XR24H-TAB (METOPROLOL SUCCINATE) Take one tablet by mouth daily  #30 x 6   Entered by:   Teressa Lower RN   Authorized by:   Kathlen Brunswick, MD, [facility]   Signed by:   Teressa Lower RN on 04/25/2009   Method used:   Electronically to        [facility] (retail)       [address]       [place], [place]  [address]       Ph: [phone] or [phone]       Fax: [phone]   RxID:    [id number]

## 2010-05-05 NOTE — Medication Information (Signed)
Summary: CCR  Anticoagulant Therapy  Managed by: Vashti Hey, RN PCP: Dr Nobie Putnam  Supervising MD: Dietrich Pates MD, Darlyn Read Used: [facility] [place] Site:  INR POC 5.2     Recent/future hospitalizations: yes       Details: [facility] 6/20 - 6/22  Abalation was cancelled and DCCV performed  Any changes in medication regimen? yes       Details: Started on Amiodarone 200mg  bid  Recent/future dental: no  Any missed doses?: no       Is patient compliant with meds? yes       Allergies: No Known Drug Allergies  Anticoagulation Management History:      The patient is taking warfarin and comes in today for a routine follow up visit.  Positive risk factors for bleeding include an age of 75 years or older.  The bleeding index is 'intermediate risk'.  Positive CHADS2 values include History of HTN and Age > 3 years old.  Her last INR was 2.91.  Anticoagulation responsible provider: Dietrich Pates MD, Molly Maduro.  INR POC: 5.2.  Cuvette Lot#: [id number].    Anticoagulation Management Assessment/Plan:      The patient's current anticoagulation dose is Warfarin sodium 3 mg tabs: Use as directed by Anticoagualtion Clinic.  The target INR is 2.0-3.0.  The next INR is due 10/01/2009.  Anticoagulation instructions were given to patient.  Results were reviewed/authorized by Vashti Hey, RN.  She was notified by Vashti Hey RN.        Coagulation management information includes: Scheduled for ablation 09/22/09   Ablation was cancelled and DCCV performed 09/22/09  Started on amiodarone 200mg  two times a day 09/23/09.  Prior Anticoagulation Instructions: INR 3.2 Take Coumadin 1/2 tablet tomorrow night then resume 1 tablet once daily except 1/2 tablet on Mondays and Thursdays  Current Anticoagulation Instructions: INR 5.2 Pt was sent home from hospital 6/22 on coumadin 3mg  once daily  Pt took coumadin this morning Hold coumadin tomorrow and recheck 10/01/09 at Dr Ladona Ridgel appt.

## 2010-05-05 NOTE — Medication Information (Signed)
Summary: CCR  Anticoagulant Therapy  Managed by: Vashti Hey, RN PCP: Robbie Lis  Supervising MD: Diona Browner MD, Alexander Mt Used: [facility] [place] Site: [place] INR POC 4.1  Dietary changes: no    Health status changes: no    Bleeding/hemorrhagic complications: no    Recent/future hospitalizations: no    Any changes in medication regimen? no    Recent/future dental: no  Any missed doses?: no       Is patient compliant with meds? yes       Allergies: No Known Drug Allergies  Anticoagulation Management History:      The patient is taking warfarin and comes in today for a routine follow up visit.  Positive risk factors for bleeding include an age of 37 years or older.  The bleeding index is 'intermediate risk'.  Positive CHADS2 values include History of HTN and Age > 45 years old.  Her last INR was 2.91.  Anticoagulation responsible provider: Diona Browner MD, Remi Deter.  INR POC: 4.1.  Cuvette Lot#: [id number].    Anticoagulation Management Assessment/Plan:      The patient's current anticoagulation dose is Warfarin sodium 3 mg tabs: Use as directed by Anticoagualtion Clinic.  The target INR is 2.0-3.0.  The next INR is due 02/04/2010.  Anticoagulation instructions were given to patient.  Results were reviewed/authorized by Vashti Hey, RN.  She was notified by Vashti Hey RN.         Prior Anticoagulation Instructions: INR 2.7 Pt has been taking 1.5mg  once daily except 3mg  on Mondays Continue coumadin 1.5mg  once daily except 3mg  on mondays  Current Anticoagulation Instructions: INR 4.1 Hold coumadin tonight then decrease dose to 1.5mg  once daily except none on Saturdays

## 2010-05-05 NOTE — Miscellaneous (Signed)
Summary: labs cbcd,pt,inr,ptt,bmp,05/09/2009  Clinical Lists Changes  Observations: Added new observation of CALCIUM: 8.6 mg/dL (16/01/9603 54:09) Added new observation of CREATININE: 0.66 mg/dL (81/19/1478 29:56) Added new observation of BUN: 14 mg/dL (21/30/8657 84:69) Added new observation of BG RANDOM: 85 mg/dL (62/95/2841 32:44) Added new observation of CO2 PLSM/SER: 28 meq/L (05/09/2009 14:46) Added new observation of CL SERUM: 104 meq/L (05/09/2009 14:46) Added new observation of K SERUM: 3.4 meq/L (05/09/2009 14:46) Added new observation of NA: 144 meq/L (05/09/2009 14:46) Added new observation of INR: 1.08  (05/09/2009 14:46) Added new observation of PT PATIENT: 13.9 s (05/09/2009 14:46)

## 2010-05-05 NOTE — Miscellaneous (Signed)
Summary: LABS TSH  Clinical Lists Changes  Observations: Added new observation of TSH: 5.528 microintl units/mL (10/03/2006 15:20)

## 2010-05-05 NOTE — Assessment & Plan Note (Signed)
Summary: EPH   Visit Type:  Follow-up Referring Provider:  Dr Nobie Putnam  Primary Provider:  Dr Nobie Putnam   CC:  no cardiology complaints some sob.  History of Present Illness: Wendy Garner returns today for followup of her atrial flutter and bradycardia and HTN.  She is a pleasant elderly woman with problems as above.  She underwent EPS last week and was found to have a left atrial flutter for which she was cardioverted and started on amiodarone.  She returns for followup.  No palpitations.  She still has mild dyspnea.  Her blood pressure has been elevated and she admits to dietary indiscretion with sodium.  Current Medications (verified): 1)  Nexium 40 Mg Cpdr (Esomeprazole Magnesium) .... Take 1 Tab Daily 2)  Eye Drops 0.05 % Soln (Tetrahydrozoline Hcl) .... Use As Directed 3)  Synthroid 125 Mcg Tabs (Levothyroxine Sodium) .... Take 1 Tab Daily 4)  Daily Multiple Vitamins  Tabs (Multiple Vitamin) .... Take 1 Tab Daily 5)  Vision Vitamins  Tabs (Multiple Vitamins-Minerals) .... Take 1 Tab Daily 6)  Calcium Carbonate 600 Mg Tabs (Calcium Carbonate) .... Take 1 Tab Daily 7)  Toprol Xl 50 Mg Xr24h-Tab (Metoprolol Succinate) .... Take 1 Tab Daily 8)  Warfarin Sodium 3 Mg Tabs (Warfarin Sodium) .... Use As Directed By Anticoagualtion Clinic 9)  Amiodarone Hcl 200 Mg Tabs (Amiodarone Hcl) .... Take One Tablet By Mouth Twice A Day X 3 Weeks Then 1 Tablet Once Daily  Allergies (verified): No Known Drug Allergies  Past History:  Past Medical History: Last updated: 05/09/2009 Sick sinus syndrome: Paroxysmal atrial fibrillation with a rapid ventricular response;       extreme sinus bradycardia, junctional bradycardia and sinus arrest up to 4 seconds MACULAR DEGENERATION (ICD-362.50) Peptic ulcer disease with history of upper GI bleeding in 2004 Degenerative joint disease-hip and lumbosacral spine ADENOCARCINOMA, COLON (ICD-153.9) ATRIAL FIBRILLATION, HX OF (ICD-V12.59)  Past Surgical  History: Last updated: 05/09/2009 Cataract extraction Cholecystectomy Tonsillectomy Partial colectomy for carcinoma of the colon-2002  Review of Systems       The patient complains of dyspnea on exertion.  The patient denies chest pain, syncope, and peripheral edema.    Vital Signs:  Patient profile:   77 year old female Weight:      133 pounds Pulse rate:   87 / minute BP sitting:   162 / 83  (right arm)  Vitals Entered By: Dreama Saa, CNA (October 01, 2009 10:41 AM)  Physical Exam  General:    Well developed; no acute distress; proportionate height and weight Neck-No JVD; no carotid bruits: Lungs-normal respiratory rate; clear lung fields except for minimal left basilar rales. Thorax-pacing generator implanted in the left infraclavicular region; no edema or hematoma; well-healed incision. Cardiovascular-normal PMI; normal S1 and S2; modest basilar systolic ejection murmur Abdomen-BS normal; soft and non-tender without masses or organomegaly:  Musculoskeletal-No deformities, no cyanosis or clubbing: Neurologic-Normal cranial nerves; symmetric strength and tone:  Skin-Warm, no significant lesions: Extremities-Nl distal pulses; no edema:     PPM Specifications Following MD:  Lewayne Bunting, MD     PPM Vendor:  [employer]onic     PPM Model Number:  ADDRL1     PPM Serial Number:  [id number] PPM DOI:  05/12/2009     PPM Implanting MD:  Sherryl Manges, MD  Lead 1    Location: RA     DOI: 05/12/2009     Model #: 1944     Serial #: [id number]     Status:  active Lead 2    Location: RV     DOI: 05/12/2009     Model #: 1948     Serial #: [id number]     Status: active  Magnet Response Rate:  BOL 85 ERI  65  Indications:  Sick sinus syndrome   PPM Follow Up Battery Voltage:  2.79 V     Battery Est. Longevity:  14.58yrs     Pacer Dependent:  No       PPM Device Measurements Atrium  Amplitude: 0.70 mV, Impedance: 524 ohms, Threshold: 1.250 V at 0.40 msec Right Ventricle  Amplitude: 15.68  mV, Impedance: 1011 ohms, Threshold: 0.50 V at 0.40 msec  Episodes MS Episodes:  0     Percent Mode Switch:  0     Coumadin:  No Ventricular High Rate:  0     Atrial Pacing:  59.3%     Ventricular Pacing:  0.7%  Parameters Mode:  MVP (R)     Lower Rate Limit:  50     Upper Rate Limit:  130 Paced AV Delay:  300     Sensed AV Delay:  280 Tech Comments:  PT HAD CARDIOVERSION ON 09-22-09.  NO EPISODES SINCE LAST CHECK 09-22-09.  NORMAL DEVICE FUNCTION.  CHANGED RA OUTPUT FROM 1.5 TO 2.00 AND RV OUTPUT FROM 2.00 TO 2.50 V.  ROV IN 6 [facility]. Vella Kohler  October 01, 2009 11:19 AM MD Comments:  Agree with above.  Impression & Recommendations:  Problem # 1:  CARDIAC PACEMAKER IN SITU (ICD-V45.01) Her device is working normally.  Will recheck in several months.  She has maintained NSR.  Problem # 2:  ATRIAL FLUTTER (ICD-427.32) She is maintaining NSR after cardioversion.  I have asked her to continue her amiodarone 400 mg daily for 3 weeks then decrease down to 200 mg daily. Her updated medication list for this problem includes:    Toprol Xl 50 Mg Xr24h-tab (Metoprolol succinate) .Marland Kitchen... Take 1 tab daily    Warfarin Sodium 3 Mg Tabs (Warfarin sodium) ..... Use as directed by anticoagualtion clinic    Amiodarone Hcl 200 Mg Tabs (Amiodarone hcl) .Marland Kitchen... Take one tablet by mouth twice a day x 3 weeks then 1 tablet once daily  Problem # 3:  HYPERTENSION (ICD-401.9) Her blood pressure is elevated today.  I have asked her to maintain a low sodium diet. The following medications were removed from the medication list:    Diltiazem Hcl Er Beads 240 Mg Xr24h-cap (Diltiazem hcl er beads) .Marland Kitchen... Take 1 tablet by mouth once daily Her updated medication list for this problem includes:    Toprol Xl 50 Mg Xr24h-tab (Metoprolol succinate) .Marland Kitchen... Take 1 tab daily  Patient Instructions: 1)  Your physician recommends that you schedule a follow-up appointment in: 3 months 2)  Your physician has recommended you  make the following change in your medication: Continue to take Amiodarone two times a day for 3 more weeks then decrease to 1 tablet by mouth once daily  3)  Your physician has requested that you limit the intake of sodium (salt) in your diet. Please see [facility] handout.

## 2010-05-07 ENCOUNTER — Ambulatory Visit: Admit: 2010-05-07 | Payer: Self-pay

## 2010-05-07 ENCOUNTER — Encounter: Payer: Self-pay | Admitting: Cardiology

## 2010-05-07 ENCOUNTER — Encounter (INDEPENDENT_AMBULATORY_CARE_PROVIDER_SITE_OTHER): Payer: Medicare Other

## 2010-05-07 DIAGNOSIS — Z7901 Long term (current) use of anticoagulants: Secondary | ICD-10-CM

## 2010-05-07 DIAGNOSIS — I4891 Unspecified atrial fibrillation: Secondary | ICD-10-CM

## 2010-05-07 NOTE — Letter (Signed)
Summary: [place] [employer] [facility], agricultural at [facility]  [address] [place] [address]   Phone: [phone]  Fax: [phone]      April 13, 2010 MRN: [id number]   Wendy Garner [address] [place] [place], [place]  [address]   Dear [name],  Your test ordered by Selena Batten has been reviewed by your physician (or physician assistant) and was found to be normal or stable. Your physician (or physician assistant) felt no changes were needed at this time.  ____ Echocardiogram  ____ Cardiac Stress Test  __x__ Lab Work  ____ Peripheral vascular study of arms, legs or neck  ____ CT scan or X-ray  ____ Lung or Breathing test  ____ Other:  No change in medical treatment at this time, results have been forwarded to your Primary care doctor, per Dr. Ladona Ridgel.  Thank you, [name] RN    Heuvelton Bing, MD, Lenise Arena.C.Gaylord Shih, MD, F.A.C.C Lewayne Bunting, MD, F.A.C.C Nona Dell, MD, F.A.C.C Charlton Haws, MD, Lenise Arena.C.C

## 2010-05-07 NOTE — Cardiovascular Report (Signed)
Summary: Office Visit   Office Visit   Imported By: Roderic Ovens 04/27/2010 16:20:37  _____________________________________________________________________  External Attachment:    Type:   Image     Comment:   External Document

## 2010-05-07 NOTE — Assessment & Plan Note (Signed)
Summary: 4 mth f/u per checkout on 12/04/09/tg   Visit Type:  Follow-up Referring Provider:  Dr Nobie Putnam  Primary Provider:  [place]   CC:  some sob .  History of Present Illness: [name] returns today for followup of her atrial flutter and bradycardia and HTN.  She is a pleasant elderly woman with problems as above.  She underwent EPS several months ago and was found to have a left atrial flutter for which she was cardioverted and started on amiodarone.  She returns for followup.  No palpitations.  She still has dyspnea.  Her blood pressure has been elevated and she admits to dietary indiscretion with sodium.  She denies palpitations.  Current Medications (verified): 1)  Nexium 40 Mg Cpdr (Esomeprazole Magnesium) .... Take 1 Tab Daily 2)  Eye Drops 0.05 % Soln (Tetrahydrozoline Hcl) .... Use As Directed 3)  Synthroid 125 Mcg Tabs (Levothyroxine Sodium) .... Take 1 Tab Daily 4)  Daily Multiple Vitamins  Tabs (Multiple Vitamin) .... Take 1 Tab Daily 5)  Vision Vitamins  Tabs (Multiple Vitamins-Minerals) .... Take 1 Tab Daily 6)  Calcium Carbonate 600 Mg Tabs (Calcium Carbonate) .... Take 1 Tab Daily 7)  Toprol Xl 50 Mg Xr24h-Tab (Metoprolol Succinate) .... Take 1 Tab Daily 8)  Warfarin Sodium 3 Mg Tabs (Warfarin Sodium) .... Use As Directed By Anticoagualtion Clinic 9)  Amiodarone Hcl 200 Mg Tabs (Amiodarone Hcl) .... Take  1/2 Tablets Daily  Allergies (verified): No Known Drug Allergies  Past History:  Past Medical History: Last updated: 05/09/2009 Sick sinus syndrome: Paroxysmal atrial fibrillation with a rapid ventricular response;       extreme sinus bradycardia, junctional bradycardia and sinus arrest up to 4 seconds MACULAR DEGENERATION (ICD-362.50) Peptic ulcer disease with history of upper GI bleeding in 2004 Degenerative joint disease-hip and lumbosacral spine ADENOCARCINOMA, COLON (ICD-153.9) ATRIAL FIBRILLATION, HX OF (ICD-V12.59)  Past Surgical History: Last  updated: 05/09/2009 Cataract extraction Cholecystectomy Tonsillectomy Partial colectomy for carcinoma of the colon-2002  Review of Systems  The patient denies chest pain, syncope, dyspnea on exertion, and peripheral edema.    Vital Signs:  Patient profile:   77 year old female Weight:      135 pounds BMI:     20.60 O2 Sat:      90 % on Room air Pulse rate:   50 / minute BP sitting:   194 / 79  (left arm)  Vitals Entered By: Dreama Saa, CNA (April 09, 2010 9:55 AM)  O2 Flow:  Room air  Physical Exam  General:    Well developed; no acute distress; proportionate height and weight Neck-No JVD; no carotid bruits: Lungs-normal respiratory rate; clear lung fields except for minimal left basilar rales. Thorax-pacing generator implanted in the left infraclavicular region; no edema or hematoma; well-healed incision. Cardiovascular-normal PMI; normal S1 and S2; modest basilar systolic ejection murmur Abdomen-BS normal; soft and non-tender without masses or organomegaly:  Musculoskeletal-No deformities, no cyanosis or clubbing: Neurologic-Normal cranial nerves; symmetric strength and tone:  Skin-Warm, no significant lesions: Extremities-Nl distal pulses; no edema:     PPM Specifications Following MD:  Lewayne Bunting, MD     PPM Vendor:  [employer]     PPM Model Number:  ADDRL1     PPM Serial Number:  [id number] PPM DOI:  05/12/2009     PPM Implanting MD:  Sherryl Manges, MD  Lead 1    Location: RA     DOI: 05/12/2009     Model #: 4098  Serial #: [id number]     Status: active Lead 2    Location: RV     DOI: 05/12/2009     Model #: 1948     Serial #: [id number]     Status: active  Magnet Response Rate:  BOL 85 ERI  65  Indications:  Sick sinus syndrome   PPM Follow Up Remote Check?  No Battery Voltage:  2.79 V     Battery Est. Longevity:  10 years     Pacer Dependent:  No       PPM Device Measurements Atrium  Amplitude: 2.0 mV, Impedance: 453 ohms, Threshold: 10. V at 0.4  msec Right Ventricle  Amplitude: 11.20 mV, Impedance: 914 ohms, Threshold: 1.0 V at 0.4 msec  Episodes MS Episodes:  311     Percent Mode Switch:  2.6%     Coumadin:  Yes Ventricular High Rate:  0     Atrial Pacing:  86.7%     Ventricular Pacing:  6.2%  Parameters Mode:  DDDR     Lower Rate Limit:  50     Upper Rate Limit:  130 Paced AV Delay:  300     Sensed AV Delay:  280 Tech Comments:  Outputs reprogrammed for chronic thresholds.  Rate response on today.  Ms. Ensz s/o being "short winded"at times.   2.6% A-fib episodes with 40% heart rates > 100bpm.  No Carelink @ this time.  ROV 6 months [facility] clinic. Altha Harm, LPN  April 09, 2010 10:46 AM  MD Comments:  Agree with above.  Impression & Recommendations:  Problem # 1:  CARDIAC PACEMAKER IN SITU (ICD-V45.01) Her current device is working normally. Will followup in several months.  Problem # 2:  HYPERTENSION (ICD-401.9) Her blood pressure is elevated today but she brings in a log today which demonstrates that her SBP is less than 150 mmHg about 90% of the time. Her updated medication list for this problem includes:    Toprol Xl 50 Mg Xr24h-tab (Metoprolol succinate) .Marland Kitchen... Take 1 tab daily  Orders: T-Sed Rate (Automated) ([id number])  Problem # 3:  DYSPNEA (ICD-786.05) The etiology is unclear but may be related to the amiodarone. She does not have much cough. I have asked her to obtain some labs today and redue the amiodarone to 100 mg a day. Her updated medication list for this problem includes:    Toprol Xl 50 Mg Xr24h-tab (Metoprolol succinate) .Marland Kitchen... Take 1 tab daily  Other Orders: T-CBC w/Diff ([id number]) T-TSH [id number]) T-BNP  (B Natriuretic Peptide) [phone])  Patient Instructions: 1)  Your physician recommends that you schedule a follow-up appointment in: 5 -6 weeks 2)  Your physician recommends that you return for lab work in: today 3)  Your physician has recommended you make the following change in  your medication: Decrease Amiodarone to 1/2 tablet by mouth once daily

## 2010-05-07 NOTE — Medication Information (Signed)
Summary: ccr-lr  Anticoagulant Therapy  Managed by: Vashti Hey, RN PCP: Robbie Lis  Supervising MD: Dietrich Pates MD, Darlyn Read Used: [facility] [place] Site:  INR POC 2.4  Dietary changes: no    Health status changes: no    Bleeding/hemorrhagic complications: no    Recent/future hospitalizations: no    Any changes in medication regimen? no    Recent/future dental: no  Any missed doses?: no       Is patient compliant with meds? yes       Allergies: No Known Drug Allergies  Anticoagulation Management History:      The patient is taking warfarin and comes in today for a routine follow up visit.  Positive risk factors for bleeding include an age of 30 years or older.  The bleeding index is 'intermediate risk'.  Positive CHADS2 values include History of HTN and Age > 28 years old.  Her last INR was 2.91.  Anticoagulation responsible provider: Dietrich Pates MD, Molly Maduro.  INR POC: 2.4.  Cuvette Lot#: [id number].    Anticoagulation Management Assessment/Plan:      The patient's current anticoagulation dose is Warfarin sodium 3 mg tabs: Use as directed by Anticoagualtion Clinic.  The target INR is 2.0-3.0.  The next INR is due 05/07/2010.  Anticoagulation instructions were given to patient.  Results were reviewed/authorized by Vashti Hey, RN.  She was notified by Vashti Hey RN.         Prior Anticoagulation Instructions: INR 2.5 Continue coumadin 1.5mg  once daily except none on Mondays and Fridays  Current Anticoagulation Instructions: INR 2.4 Continue coumadin 1.5mg  once daily except none on Mondays and Fridays

## 2010-05-13 NOTE — Medication Information (Signed)
Summary: ccr-lr  Anticoagulant Therapy  Managed by: Vashti Hey, RN PCP: Robbie Lis  Supervising MD: Dietrich Pates MD, Darlyn Read Used: [facility] [place] Site: [place] INR POC 1.8  Dietary changes: no    Health status changes: no    Bleeding/hemorrhagic complications: no    Recent/future hospitalizations: no    Any changes in medication regimen? no    Recent/future dental: no  Any missed doses?: no       Is patient compliant with meds? yes       Allergies: No Known Drug Allergies  Anticoagulation Management History:      The patient is taking warfarin and comes in today for a routine follow up visit.  Positive risk factors for bleeding include an age of 75 years or older.  The bleeding index is 'intermediate risk'.  Positive CHADS2 values include History of HTN and Age > 76 years old.  Her last INR was 2.91.  Anticoagulation responsible provider: Dietrich Pates MD, Molly Maduro.  INR POC: 1.8.  Cuvette Lot#: [id number].    Anticoagulation Management Assessment/Plan:      The patient's current anticoagulation dose is Warfarin sodium 3 mg tabs: Use as directed by Anticoagualtion Clinic.  The target INR is 2.0-3.0.  The next INR is due 05/27/2010.  Anticoagulation instructions were given to patient.  Results were reviewed/authorized by Vashti Hey, RN.  She was notified by Vashti Hey RN.         Prior Anticoagulation Instructions: INR 2.4 Continue coumadin 1.5mg  once daily except none on Mondays and Fridays  Current Anticoagulation Instructions: INR 1.8 Increase coumadin to 1.5mg  once daily except none on Mondays If INR elevated next OV will need to decrease tablet strength

## 2010-05-27 ENCOUNTER — Encounter (INDEPENDENT_AMBULATORY_CARE_PROVIDER_SITE_OTHER): Payer: Medicare Other | Admitting: Internal Medicine

## 2010-05-27 ENCOUNTER — Encounter: Payer: Self-pay | Admitting: Internal Medicine

## 2010-05-27 ENCOUNTER — Encounter (INDEPENDENT_AMBULATORY_CARE_PROVIDER_SITE_OTHER): Payer: Medicare Other

## 2010-05-27 ENCOUNTER — Encounter: Payer: Self-pay | Admitting: Cardiology

## 2010-05-27 DIAGNOSIS — I4892 Unspecified atrial flutter: Secondary | ICD-10-CM

## 2010-05-27 DIAGNOSIS — Z7901 Long term (current) use of anticoagulants: Secondary | ICD-10-CM

## 2010-05-27 DIAGNOSIS — I495 Sick sinus syndrome: Secondary | ICD-10-CM

## 2010-05-27 DIAGNOSIS — I5032 Chronic diastolic (congestive) heart failure: Secondary | ICD-10-CM

## 2010-05-27 DIAGNOSIS — I4891 Unspecified atrial fibrillation: Secondary | ICD-10-CM

## 2010-05-28 ENCOUNTER — Encounter: Payer: Self-pay | Admitting: Internal Medicine

## 2010-06-02 NOTE — Medication Information (Signed)
Summary: ccr-lr  Anticoagulant Therapy  Managed by: Vashti Hey, RN PCP: Robbie Lis  Supervising MD: Diona Browner MD, Alexander Mt Used: [facility] [place] Site: [place] INR POC 2.0  Dietary changes: no    Health status changes: no    Bleeding/hemorrhagic complications: no    Recent/future hospitalizations: no    Any changes in medication regimen? no    Recent/future dental: no  Any missed doses?: no       Is patient compliant with meds? yes       Allergies: No Known Drug Allergies  Anticoagulation Management History:      The patient is taking warfarin and comes in today for a routine follow up visit.  Positive risk factors for bleeding include an age of 75 years or older.  The bleeding index is 'intermediate risk'.  Positive CHADS2 values include History of HTN and Age > 6 years old.  Her last INR was 2.91.  Anticoagulation responsible provider: Diona Browner MD, Remi Deter.  INR POC: 2.0.  Cuvette Lot#: [id number].    Anticoagulation Management Assessment/Plan:      The patient's current anticoagulation dose is Warfarin sodium 3 mg tabs: Use as directed by Anticoagualtion Clinic.  The target INR is 2.0-3.0.  The next INR is due 06/24/2010.  Anticoagulation instructions were given to patient.  Results were reviewed/authorized by Vashti Hey, RN.  She was notified by Vashti Hey RN.         Prior Anticoagulation Instructions: INR 1.8 Increase coumadin to 1.5mg  once daily except none on Mondays If INR elevated next OV will need to decrease tablet strength   Current Anticoagulation Instructions: INR 2.0 Continue coumadin 1.5mg  once daily except none on Mondays

## 2010-06-02 NOTE — Assessment & Plan Note (Signed)
Summary: 5-6 wk f/u per checkout on 04/09/10/tg/hm   Visit Type:  Follow-up Referring Provider:  Dr Nobie Putnam  Primary Provider:  Dr.Golding  CC:  no cardiology complaints.  History of Present Illness: Wendy Garner returns today for followup. She is a pleasant elderly woman with a h/o left atrial flutter, s/p EPS where she was cardioverted and placed on amiodarone. She initially did well but when I saw her last several weeks ago she had dyspnea and labs demonstrated an elevated BNP and ESR. I had her reduce her amiodarone and start a beta blocker.  She returns today for followup. In the interim, she is improved. She notes that her dyspnea is much improved. No edema or palpitations.  Current Medications (verified): 1)  Nexium 40 Mg Cpdr (Esomeprazole Magnesium) .... Take 1 Tab Daily 2)  Eye Drops 0.05 % Soln (Tetrahydrozoline Hcl) .... Use As Directed 3)  Synthroid 125 Mcg Tabs (Levothyroxine Sodium) .... Take 1 Tab Daily 4)  Daily Multiple Vitamins  Tabs (Multiple Vitamin) .... Take 1 Tab Daily 5)  Vision Vitamins  Tabs (Multiple Vitamins-Minerals) .... Take 1 Tab Daily 6)  Calcium Carbonate 600 Mg Tabs (Calcium Carbonate) .... Take 1 Tab Daily 7)  Toprol Xl 50 Mg Xr24h-Tab (Metoprolol Succinate) .... Take 1 Tab Daily 8)  Warfarin Sodium 3 Mg Tabs (Warfarin Sodium) .... Use As Directed By Anticoagualtion Clinic 9)  Amiodarone Hcl 200 Mg Tabs (Amiodarone Hcl) .... Take  1/2 Tablets Daily  Allergies (verified): No Known Drug Allergies  Comments:  Nurse/Medical Assistant: patient and daughter reviewed meds she uses [facility]  Past History:  Past Medical History: Last updated: 05/09/2009 Sick sinus syndrome: Paroxysmal atrial fibrillation with a rapid ventricular response;       extreme sinus bradycardia, junctional bradycardia and sinus arrest up to 4 seconds MACULAR DEGENERATION (ICD-362.50) Peptic ulcer disease with history of upper GI bleeding in 2004 Degenerative joint  disease-hip and lumbosacral spine ADENOCARCINOMA, COLON (ICD-153.9) ATRIAL FIBRILLATION, HX OF (ICD-V12.59)  Past Surgical History: Last updated: 05/09/2009 Cataract extraction Cholecystectomy Tonsillectomy Partial colectomy for carcinoma of the colon-2002  Review of Systems  The patient denies chest pain, syncope, dyspnea on exertion, and peripheral edema.    Vital Signs:  Patient profile:   77 year old female Weight:      133 pounds BMI:     20.30 Pulse rate:   84 / minute BP sitting:   182 / 92  (left arm)  Vitals Entered By: Dreama Saa, CNA (May 27, 2010 9:50 AM)  Physical Exam  General:    Well developed; no acute distress; proportionate height and weight Neck-No JVD; no carotid bruits: Lungs-normal respiratory rate; clear lung fields except for minimal left basilar rales. Thorax-pacing generator implanted in the left infraclavicular region; no edema or hematoma; well-healed incision. Cardiovascular-normal PMI; normal S1 and S2; modest basilar systolic ejection murmur Abdomen-BS normal; soft and non-tender without masses or organomegaly:  Musculoskeletal-No deformities, no cyanosis or clubbing: Neurologic-Normal cranial nerves; symmetric strength and tone:  Skin-Warm, no significant lesions: Extremities-Nl distal pulses; no edema:     PPM Specifications Following MD:  Lewayne Bunting, MD     PPM Vendor:  [employer]     PPM Model Number:  [id number]     PPM Serial Number:  [id number] PPM DOI:  05/12/2009     PPM Implanting MD:  Sherryl Manges, MD  Lead 1    Location: RA     DOI: 05/12/2009     Model #: [id number]  Serial #: [id number]     Status: active Lead 2    Location: RV     DOI: 05/12/2009     Model #: [id number]     Serial #: [id number]     Status: active  Magnet Response Rate:  BOL 85 ERI  65  Indications:  Sick sinus syndrome   PPM Follow Up Pacer Dependent:  No      Episodes Coumadin:  Yes  Parameters Mode:  DDDR     Lower Rate Limit:  50     Upper Rate  Limit:  130 Paced AV Delay:  300     Sensed AV Delay:  280 MD Comments:  Normal device function.  Impression & Recommendations:  Problem # 1:  CARDIAC PACEMAKER IN SITU (ICD-V45.01) Her device is working normally. Will follow in several months.  Problem # 2:  DYSPNEA (ICD-786.05) Her symptoms have improved. She will continue her low dose of beta blocker and low sodium diet. Her updated medication list for this problem includes:    Toprol Xl 50 Mg Xr24h-tab (Metoprolol succinate) .Marland Kitchen... Take 1 tab daily  Problem # 3:  ATRIAL FLUTTER (ICD-427.32) Her heart has maintained NSR. Will continue her current meds and I will see her back in several months. Her updated medication list for this problem includes:    Toprol Xl 50 Mg Xr24h-tab (Metoprolol succinate) .Marland Kitchen... Take 1 tab daily    Warfarin Sodium 3 Mg Tabs (Warfarin sodium) ..... Use as directed by anticoagualtion clinic    Amiodarone Hcl 200 Mg Tabs (Amiodarone hcl) .Marland Kitchen... Take  1/2 tablets daily  Patient Instructions: 1)  Your physician recommends that you schedule a follow-up appointment in: 3 months 2)  Your physician recommends that you continue on your current medications as directed. Please refer to the Current Medication list given to you today.

## 2010-06-20 ENCOUNTER — Encounter: Payer: Self-pay | Admitting: Cardiology

## 2010-06-20 DIAGNOSIS — I4892 Unspecified atrial flutter: Secondary | ICD-10-CM

## 2010-06-20 DIAGNOSIS — Z7901 Long term (current) use of anticoagulants: Secondary | ICD-10-CM | POA: Insufficient documentation

## 2010-06-21 LAB — HEPATIC FUNCTION PANEL
ALT: 18 U/L (ref 0–35)
AST: 22 U/L (ref 0–37)
Albumin: 3.2 g/dL — ABNORMAL LOW (ref 3.5–5.2)
Alkaline Phosphatase: 70 U/L (ref 39–117)
Total Bilirubin: 0.5 mg/dL (ref 0.3–1.2)

## 2010-06-21 LAB — PROTIME-INR
INR: 2.61 — ABNORMAL HIGH (ref 0.00–1.49)
Prothrombin Time: 27.7 seconds — ABNORMAL HIGH (ref 11.6–15.2)
Prothrombin Time: 28.9 seconds — ABNORMAL HIGH (ref 11.6–15.2)

## 2010-06-24 ENCOUNTER — Ambulatory Visit (INDEPENDENT_AMBULATORY_CARE_PROVIDER_SITE_OTHER): Payer: Medicare Other | Admitting: *Deleted

## 2010-06-24 DIAGNOSIS — I4892 Unspecified atrial flutter: Secondary | ICD-10-CM

## 2010-06-24 DIAGNOSIS — Z7901 Long term (current) use of anticoagulants: Secondary | ICD-10-CM

## 2010-06-24 LAB — POCT INR: INR: 2

## 2010-07-20 ENCOUNTER — Ambulatory Visit (INDEPENDENT_AMBULATORY_CARE_PROVIDER_SITE_OTHER): Payer: Medicare Other | Admitting: *Deleted

## 2010-07-20 DIAGNOSIS — Z7901 Long term (current) use of anticoagulants: Secondary | ICD-10-CM

## 2010-07-20 DIAGNOSIS — I4892 Unspecified atrial flutter: Secondary | ICD-10-CM

## 2010-08-06 ENCOUNTER — Ambulatory Visit (INDEPENDENT_AMBULATORY_CARE_PROVIDER_SITE_OTHER): Payer: Medicare Other | Admitting: Internal Medicine

## 2010-08-06 DIAGNOSIS — K625 Hemorrhage of anus and rectum: Secondary | ICD-10-CM

## 2010-08-07 ENCOUNTER — Telehealth: Payer: Self-pay | Admitting: Internal Medicine

## 2010-08-07 NOTE — Telephone Encounter (Signed)
Dr Ladona Ridgel   Is this OK with you?

## 2010-08-07 NOTE — Telephone Encounter (Signed)
Dr Samuel Jester office [phone] calling because pt is set up to have colonoscopy on 08/21/10 and needs to come off coumadin 5 days prior. Need ok from doctor.

## 2010-08-09 NOTE — H&P (Addendum)
NAME:  Wendy Garner, EBERLE          ACCOUNT NO.:  [id number]  MEDICAL RECORD NO.:  [id number]           PATIENT TYPE:  LOCATION:                                 FACILITY:  PHYSICIAN:  Lionel December, M.D.    DATE OF BIRTH:  24-May-1915  DATE OF ADMISSION: DATE OF DISCHARGE:  LH                             HISTORY & PHYSICAL   PRIMARY CARE PHYSICIAN:  Dr. Geanie Cooley.  PRESENTING COMPLAINT:  Rectal bleeding, 2 episodes in the last 10 days.  HISTORY OF PRESENT ILLNESS:  Wendy Garner is a 77 year old Caucasian female patient of Dr. Phillips Odor who is here for scheduled visit.  She has a history of colon carcinoma and has remained in remission.  Her last colonoscopy was in March 2003 by Dr. Terrial Rhodes and was normal.  She has not had any exam since then.  She states she has been doing well until about 10 days ago when she thought she either have a bowel movement and passed some stool and lot of blood.  She had another 2-3 bowel movements.  She does not recall that she was constipated, had diarrhea, abdominal pain, nausea, or vomiting.  Next episode occurred 1 week ago when she noted bright red blood mixed with stool.  It was moderate amount.  Once again, she did not have any associated symptoms. She called our office through her friend and requested to be seen.  She is accompanied by her daughter, Wendy Garner.  She states she has a good appetite.  She denies weight loss.  She says her bowels generally move regularly.  She has not experienced any abdominal pain or cramps. She is on warfarin and she gets her INR checked through [facility]. She states her heartburn is well controlled with therapy.  She has had occasional dysphagia but food bolus passes down after few seconds.  She did have her EGD and ED by me in February 2004.  She had high-grade Schatzki ring and a moderate-sized sliding hiatal hernia with erosions/ulcers at the level of diaphragmatic hiatus.  She has  been maintained on a PPI ever since.  At that time, she was also noted to be anemic with hemoglobin of 6.6 and felt to be bleeding from her upper GI tract.  Since she just had a colonoscopy in March 2003, we did not pursue with this test.  CURRENT MEDICATIONS: 1. Nexium 40 mg p.o. q.a.m. 2. Ocuvite one daily. 3. MVI daily. 4. Calcium 600 mg daily. 5. Synthroid 125 mcg daily. 6. Warfarin 1.5 mg 6 days each week and 3 mg 1 day each week. 7. Metoprolol 50 mg p.o. daily. 8. Amiodarone 100 mg p.o. daily. 9. Bromday eyedrops 1 drop to each eye daily.  PAST MEDICAL HISTORY:  History of colon carcinoma status post right hemicolectomy in January 2000.  Her most recent colonoscopy was by Dr. Terrial Rhodes in March 2003 and she has not had any follow up exam.  History of anemia and GI bleed back in February 2004, felt to be bleeding from ulcers involving the herniated part of the stomach.  She had a benign lesion removed from her left breast  several years ago. She has had bilateral cataract extraction.  She has had tonsillectomy aswell as cholecystectomy.  History of thyroid nodule status post total thyroidectomy in May 2008 and now on replacement therapy.  She has a dual-chamber pacemaker which was planted in February 2011.  History of atrial fibrillation diagnosed in December 2010.  She has bilateral macular degeneration which she instill few drops during the daytime and is able to read with magnifying glasses.  ALLERGIES:  NK.  FAMILY HISTORY:  Mother died of stomach cancer, no further details available.  Father had Alzheimer disease and coronary artery disease and lived to be 60.  She has one sister living who is 1 years old and lives at home.  Three others sister are deceased.  They are all died of various cancers.  They were in their 70s or 80s.  SOCIAL HISTORY:  She is widowed.  She has 2 children, daughter who lives close by and son who lives in [place], [place].   She is retired.  She lives alone.  She has not smoked in several years.  OBJECTIVE:  VITAL SIGNS:  Weight 137 pounds, she is 68 inches tall, pulse 88 per minute, blood pressure 118/62, and temperature is 97.5. GENERAL:  She appears much younger than stated age. HEENT:  Conjunctivae are pink.  Sclerae are nonicteric.  Oropharyngeal mucosa is normal. NECK:  No neck masses or thyromegaly noted. CARDIAC:  Regular rhythm.  Normal S1 and S2.  No murmur or gallop noted. LUNGS:  Clear to auscultation. ABDOMEN:  Symmetrical.  Bowel sounds are normal.  On palpation, soft abdomen without tenderness, organomegaly, or masses. RECTAL:  By Ms. Leandra Kern today reveal guaiac-negative stool. EXTREMITIES:  She does not have peripheral edema or clubbing.  ASSESSMENT:  [name] is a very healthy-appearing 77 year old Caucasian female who has history of colon carcinoma status post right hemicolectomy in January 2000 whose last colonoscopy was in March 2003 who presents with rectal bleeding.  She has 2 episodes, one occurred 10 days ago and second one 1 week ago.  She does not have any constitutional symptoms.  She is chronically anticoagulated because of history of atrial fibrillation.  Given her history of colorectal carcinoma, one has to be concerned that she could easily be bleeding from internal hemorrhoids in the setting of anticoagulation.  RECOMMENDATIONS:  She will need a diagnostic colonoscopy.  I have reviewed the procedure risks with the patient and her daughter and they are both agreeable.  She will need to be off her Coumadin for 4 days or so.  We will check with Dr. Ladona Ridgel, her cardiologist, to make sure that this is acceptable.  She will have H and H at the time on the day of the procedure.  The patient requests to delay the procedure for 2 weeks.                                           ______________________________ Lionel December, M.D.     NR/MEDQ  D:  08/06/2010  T:   08/07/2010  Job:  [id number]  cc:   Dr. Suann Larry. Ladona Ridgel, MD [address] [place] [address]  Electronically Signed by Lionel December M.D. on 09/06/2010 01:18:35 PM

## 2010-08-17 ENCOUNTER — Encounter: Payer: Medicare Other | Admitting: *Deleted

## 2010-08-21 ENCOUNTER — Ambulatory Visit (HOSPITAL_COMMUNITY)
Admission: RE | Admit: 2010-08-21 | Discharge: 2010-08-21 | Disposition: A | Discharge status: Home / Self Care | Payer: Medicare Other | Source: Ambulatory Visit | Attending: Internal Medicine | Admitting: Internal Medicine

## 2010-08-21 ENCOUNTER — Encounter (HOSPITAL_BASED_OUTPATIENT_CLINIC_OR_DEPARTMENT_OTHER): Payer: Medicare Other | Admitting: Internal Medicine

## 2010-08-21 DIAGNOSIS — K625 Hemorrhage of anus and rectum: Secondary | ICD-10-CM

## 2010-08-21 DIAGNOSIS — Z9049 Acquired absence of other specified parts of digestive tract: Secondary | ICD-10-CM | POA: Insufficient documentation

## 2010-08-21 DIAGNOSIS — Z85038 Personal history of other malignant neoplasm of large intestine: Secondary | ICD-10-CM | POA: Insufficient documentation

## 2010-08-21 DIAGNOSIS — Z79899 Other long term (current) drug therapy: Secondary | ICD-10-CM | POA: Insufficient documentation

## 2010-08-21 DIAGNOSIS — I10 Essential (primary) hypertension: Secondary | ICD-10-CM | POA: Insufficient documentation

## 2010-08-21 DIAGNOSIS — K5730 Diverticulosis of large intestine without perforation or abscess without bleeding: Secondary | ICD-10-CM

## 2010-08-21 DIAGNOSIS — Z7901 Long term (current) use of anticoagulants: Secondary | ICD-10-CM | POA: Insufficient documentation

## 2010-08-21 DIAGNOSIS — K644 Residual hemorrhoidal skin tags: Secondary | ICD-10-CM

## 2010-08-21 DIAGNOSIS — I4891 Unspecified atrial fibrillation: Secondary | ICD-10-CM | POA: Insufficient documentation

## 2010-08-21 LAB — HEMOGLOBIN AND HEMATOCRIT, BLOOD: HCT: 27.4 % — ABNORMAL LOW (ref 36.0–46.0)

## 2010-08-21 NOTE — Op Note (Signed)
[place]  Patient:    Wendy Garner, Wendy Garner                   MRN: [id number] Proc. Date: 10/10/00 Adm. Date:  [id number] Attending:  Meredith Leeds                           Operative Report  PREOPERATIVE DIAGNOSIS:  Carcinoma of the right colon.  POSTOPERATIVE DIAGNOSIS:  Carcinoma of the right colon.  PROCEDURE:  Right colectomy.  SURGEON:  Zigmund Daniel, M.D.  ASSISTANT:  Lorne Skeens. Hoxworth, M.D.  ANESTHESIA:  General.  DESCRIPTION OF PROCEDURE:  After adequate general anesthesia and routine preparation and draping of the abdomen, I made a midline incision and entered the abdomen.  There it was free of adhesions except in the right lower quadrant.  There were some adhesions from a previous cholecystectomy.  I sharply took those down and used cautery as well.  There was a tumor at about the hepatic flexure.  It was mobile.  The remainder of the colon felt normal. The liver felt normal and appeared to be normal.  No other abnormalities were seen.  I took down the adhesions so that the hepatic flexure was mobile, taking care to avoid injury to the duodenum.  I segmentally divided some of the greater omentum to be resected with the tumor and freed up the hepatic flexure by lateral dissection.  I then dissected down the ascending colon, getting good mobility of the ascending colon and terminal ileum.  I divided the bowel proximally and distally through the distal ileum and right transverse colon utilizing the cutting stapler.  I segmentally resected the mesentery between clamps, ligating the vessels with 2-0 silks.  I then removed the right colon.  There was a gross clean resection with no large lymph nodes encountered.  I got good hemostasis.  I approximated the ends of the bowel with suture and cut out a small segment of the staple line on the antimesenteric side of each one and did a side-to-side anastomosis with the cutting  stapler.  I then inspected the staple lines and found that there was no bleeding.  I did find one little bleeder right at the corner and suture ligated that with 3-0 silk.  I closed the remaining defect with a linear stapler and then removed the excess tissue.  Vascularity appeared excellent, and the closure appeared secure.  I closed the mesenteric defect with interrupted 3-0 silk stitches.  I then copiously irrigated the area and removed the irrigant.  After changing gloves, I closed the abdomen with running #1 PDS beginning at each end, tying a knot in the middle.  I irrigated the subcutaneous tissues and closed the skin with staples.  Sponge, needle, and instrument counts were correct.  The patient was stable through the operation. DD:  10/10/00 TD:  10/10/00 Job: [id number]

## 2010-08-21 NOTE — Op Note (Signed)
NAME:  Wendy Garner, Wendy Garner          ACCOUNT NO.:  [id number]   MEDICAL RECORD NO.:  [id number]          PATIENT TYPE:  AMB   LOCATION:  SDS                          FACILITY:  [facility]   PHYSICIAN:  Velora Heckler, MD      DATE OF BIRTH:  Jun 23, 1915   DATE OF PROCEDURE:  08/19/2006  DATE OF DISCHARGE:                               OPERATIVE REPORT   PREOPERATIVE DIAGNOSIS:  Right thyroid mass, retrosternal.   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   PROCEDURE:  Total thyroidectomy.   SURGEON:  Velora Heckler, MD, FACS   ASSISTANT:  Claud Kelp, MD, FACS   ANESTHESIA:  General per Dr. Claybon Jabs.   ESTIMATED BLOOD LOSS:  Minimal.   PREPARATION:  Betadine.   COMPLICATIONS:  None.   INDICATIONS:  The patient is an 77 year old white female from  [place], [place].  She is referred by Patrica Duel, M.D. for  right thyroid mass.  This had been an incidental finding on CT scan of  the chest December2007.  Ultrasound in December2007 at [facility] demonstrated a dominant mass in the right thyroid lobe  measuring 5.3 cm in size and extending beneath the right clavicle.  The  patient was referred to general surgery for resection.   BODY OF REPORT:  Procedure was done in OR #17 at [facility].  The patient is brought to the operating room, placed in  supine position on the operating room table.  Following administration  of general anesthesia the patient is positioned and then prepped and  draped in the usual strict aseptic fashion.  After ascertaining that an  adequate level of anesthesia been obtained, a Kocher incision was made a  #15 blade.  Dissection was carried through subcutaneous tissues.  Hemostasis was obtained with electrocautery.  Skin flaps were elevated  cephalad and caudad.  Weitlaner retractor was placed for exposure.  Strap muscles were incised in the midline.  Dissection was begun on  the  left side of the neck.  Left thyroid lobe is exposed.  Left lobe is  moderately enlarged.  There is a dominant nodule posteriorly.   At this point we turned our attention to the right side in order to  resect the dominant mass first.  Strap muscles were again mobilized off  of the thyroid capsule.  Lobe is exposed.  Right lobe is multinodular.  Nodules extend from the superior pole into the mediastinum.  With gentle  blunt dissection, I am able to palpate the bottom of the gland in the  chest well below the level of the clavicle.  Superior pole was dissected  out first.  Superior pole vessels were dissected out and ligated with  medium Ligaclips and then divided with the harmonic scalpel.  Parathyroid tissue is identified and preserved.  Gland is rolled  anteriorly.  Middle thyroid vein is divided between Ligaclips with the  harmonic scalpel.  Inferiorly the substernal mass is gently mobilized  with blunt dissection and elevated into the wound.  Venous tributaries  were divided with the harmonic scalpel.  Inferior parathyroid gland is  identified and preserved on its vascular pedicle.  Recurrent nerve was  identified and preserved.  Branches of the inferior thyroid artery are  divided between small Ligaclips with the harmonic scalpel.  Dissection  is carried up to the ligament of Allyson Sabal which was transected with  electrocautery.  Gland is then mobilized across the midline, mobilizing  the isthmus off the anterior trachea with the electrocautery.  Good  hemostasis is noted.  Dry pack is placed in the right neck.   Next we turned our attention back to the left thyroid lobe.  Again strap  muscles are reflected laterally.  Superior pole vessels were dissected  out, ligated with medium Ligaclips and divided with the harmonic  scalpel.  Middle thyroid vein is divided between Ligaclips with the  harmonic scalpel.  Inferior venous tributaries were divided with the  harmonic scalpel.  Gland  is rolled anteriorly.  Both the superior and  inferior parathyroid glands were identified and preserved.  Recurrent  nerve was easily visualized and preserved.  Branches of the inferior  thyroid artery are divided between small Ligaclips.  Ligament of Allyson Sabal  is transected with electrocautery.  Gland is then excised off the  anterior trachea.  There is no pyramidal lobe visualized.  Sutures used  to mark the right superior pole.  The entire thyroid is submitted to  pathology for review.  Neck is irrigated bilaterally with warm saline.  Surgicel was placed over the area of the recurrent nerves and  parathyroid glands bilaterally.  Good hemostasis is noted.  Strap  muscles were reapproximated in the midline with interrupted 3-0 Vicryl  sutures.  Platysma was closed with interrupted 3-0 Vicryl sutures.  Skin  was closed with running 4-0 Vicryl subcuticular suture.  Wound is washed  and dried and Benzoin and Steri-Strips are applied.  Sterile dressings  were applied.  The patient is awakened from anesthesia and brought to  the recovery room in stable condition.  The patient tolerated the  procedure well.      Velora Heckler, MD  Electronically Signed     TMG/MEDQ  D:  08/19/2006  T:  08/19/2006  Job:  [id number]   cc:   Patrica Duel, M.D.  Alford Highland. Rankin, M.D.

## 2010-08-21 NOTE — Consult Note (Signed)
NAME:  ETHYLENE, REZNICK                      ACCOUNT NO.:  [id number]   MEDICAL RECORD NO.:  [id number]                  PATIENT TYPE:   LOCATION:                                       FACILITY:  [facility]   PHYSICIAN:  R. Roetta Sessions, M.D.              DATE OF BIRTH:  April 28, 1915   DATE OF CONSULTATION:  05/07/2002  DATE OF DISCHARGE:                                   CONSULTATION   GASTROENTEROLOGY CONSULTATION:   PHYSICIAN REQUESTING CONSULTATION:  Madelin Rear. Sherwood Gambler, M.D.   REASON FOR CONSULTATION:  Anemia and dysphagia.   HISTORY OF PRESENT ILLNESS:  The patient is an 77 year old Caucasian female,  patient of Dr. Sherwood Gambler, who presents today for further evaluation of anemia  and dysphagia.  She has a history of colon cancer diagnosed in July 2002.  She underwent resection by Dr. Orson Slick.  She did not require chemotherapy.  She has since had a followup colonoscopy March 2003 by Dr. Corinda Gubler which she  reports to be normal.  The patient states that she saw Dr. Orson Slick in January  2004 and was complaining of feeling very fatigued and having dyspnea on  exertion.  Hemoglobin was 6.3.  She denies having any Hemoccult cards  checked.  He asked her to follow up with her primary care which she has  done.  On 05/03/02, Dr. Sherwood Gambler did hematocrit in the office and it was 24%.  The patient was on Slow Fe one a day at that point.  She has been on iron  now a couple weeks' total.  No Hemoccults have been obtained.  The patient  denies any melena.  She sees occasional bright red blood per rectum if she  has to strain with defecation.  Her bowels generally are regular, however.  Denies any abdominal pain, nausea or vomiting, heartburn.  Occasionally, she  feels full in her esophagus and will a Tum with relief.  She does complain  of dysphagia to solid foods which she has had for years.  Recently, she has  been having food become lodged and she has to vomit this back out.  Denies  any dysphagia to  liquids or pills.  She has no history of peptic ulcer  disease.  She is taking Aleve approximately one daily.  Denies any weight  loss.   CURRENT MEDICATIONS:  1. Slow Fe b.i.d.  2. Aleve one once daily p.r.n.  3. Tums p.r.n.   ALLERGIES:  No known drug allergies.   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   PAST SURGICAL HISTORY:  Colon resection as outlined above, cholecystectomy,  tonsillectomy, bilateral cataract extraction, left breast cyst which was  benign.   FAMILY HISTORY:  Mother died of stomach cancer age 1.  She had a sister who  died of lung cancer, one from leukemia, and one from breast cancer.   SOCIAL HISTORY:  She is widowed.  She has two children.  She is retired.  She has never been a smoker.  Denies any alcohol use.   REVIEW OF SYSTEMS:  Please see HPI for GI/GENERAL.  CARDIOPULMONARY:  Complains of increased shortness of breath and dyspnea on exertion.  Denies  any chest pain/palpitations.  Denies any presyncope symptoms.  GENITOURINARY: Denies any hematuria/dysuria.   PHYSICAL EXAMINATION:  VITAL SIGNS:  Weight 162, height 5 feet 8 inches,  temperature 96.1, blood pressure 160/70, pulse 80.  GENERAL:  Pleasant well-nourished well-developed Caucasian female in no  acute distress.  SKIN:  Warm and dry; no jaundice.  HEENT:  Pupils equal, round and reactive to light.  Conjunctivae are pink.  Sclerae are nonicteric.  Oropharyngeal mucosa moist and pink; no lesions,  erythema, or exudate.  No lymphadenopathy, thyromegaly, or carotid bruits.  CHEST:  Lungs clear to auscultation.  CARDIAC:  Regular rate and rhythm, normal S1, S2; no murmurs, rubs, or  gallops.  ABDOMEN:  Positive bowel sounds, soft, nontender, nondistended; no  organomegaly or masses.  EXTREMITIES:  No edema.  RECTAL:  Exam not performed, Hemoccult may have provided false positive  given iron use.   IMPRESSION:  The patient  is a pleasant 77 year old lady who was recently  found to have a significantly low hemoglobin of 6.3.  She has had no overt  gastrointestinal bleed to explain the degree of her anemia.  She denies  melena and only has occasional small volume bright red blood per rectum with  straining and passage of hard stools.  Reassuringly, she has had a  colonoscopy within the last 12 months which was unremarkable.  Given that  she is on Aleve, she would be at increased risk of having peptic ulcer  disease or erosive gastritis.  She also has dysphagia which may be an  esophageal web, ring, or stricture, however, would be concerned about other  esophageal lesion which may explain her anemia.  Initially, I would  recommend EGD for further evaluation of her dysphagia and anemia.  Again, no  Hemoccults have been performed, however, I do suspect that she could have a  slow occult gastrointestinal bleed.   PLAN:  1. She will hold Aleve for now.  2. EGD in the near future.  3. We are trying to obtain labs done on 05/03/02 through Dr. Sharyon Medicus office     for our review.  This would include a thyroid, PT/PTT, H. pylori, CBC,     MET-7.  4. Further recommendations to follow.   I would like to thank Dr. Sherwood Gambler for allowing [place] to take part in the care of  this patient.   The patient requesting workup by Dr. Karilyn Cota.     Tana Coast, Pricilla Larsson, M.D.    LL/MEDQ  D:  05/07/2002  T:  05/07/2002  Job:  [id number]   cc:   Madelin Rear. Sherwood Gambler, M.D.  [address]  [place]  [place] [address]  Fax: [phone]   Lionel December, M.D.  [address]  [place]  [place] [address]  Fax: [phone]

## 2010-08-21 NOTE — Op Note (Signed)
[place]. [facility]  Patient:    Wendy Garner, Wendy Garner Visit Number: [id number] MRN: [id number]          Service Type: Attending:  Ernesto Rutherford, M.D. Dictated by:   Ernesto Rutherford, M.D. Proc. Date: 08/02/01                             Operative Report  PREOPERATIVE DIAGNOSES: 1. Retained lens fragment, nonmagnetic foreign body of the left eye. 2. Aphakia of the left eye. 3. Vitreous hemorrhage, left eye.  POSTOPERATIVE DIAGNOSES: 1. Retained lens fragment, nonmagnetic foreign body of the left eye. 2. Aphakia of the left eye. 3. Vitreous hemorrhage, left eye. 4. Retinal hole found at the 6:30 meridian.  PROCEDURES: 1. Posterior vitrectomy with focal laser photocoagulation, left eye. 2. Close retinal hole. 3. Retinal cryopexy, left eye, to the retinal tear. 4. Anterior chamber wash-out. 5. Insertion of anterior chamber intraocular lens, secondary. 6. Peripheral iridectomy. 7. Removal of nonmagnetic foreign body material in the vitreous cavity, left    eye.  SURGEON:  Ernesto Rutherford, M.D.  ANESTHESIA:  Local retrobulbar with monitored anesthesia control.  INDICATION FOR PROCEDURE:  The patient is an 77 year old woman with profound vision loss on the basis of aphakia after capsule rupture during recent intraocular lens placement and posterior dislocation into the vitreous cavity of lens fragments, development of vitreous hemorrhage.  This is an attempt to remove the inflammatory debris in the vitreous cavity as well as to correct the aphakic situation.  The patient understands the risks of anesthesia, including the rare occurrence of death, and loss to the eye, including hemorrhage, infection, scarring, need for further surgery, no change in vision, loss of vision, and progression of disease despite intervention. Appropriate signed consent was obtained, and she was taken to the operating room.  DESCRIPTION OF PROCEDURE:  In the operating  room, appropriate monitoring was followed by mild sedation.  Marcaine 0.75% was delivered 5 cc retrobulbar followed by an additional 5 cc laterally in the fashion of modified [name]. The left periocular region was sterilely prepped and draped in the usual ophthalmic fashion.  A lid speculum applied.  A conjunctival peritomy fashioned superiorly and inferotemporally.  A 4 mm infusion was secured 3.5 mm posterior to the limbus in the inferotemporal quadrant.  Placement in the vitreous cavity was verified.  The superior sclerotomy was then fashioned. Wall microscope placed in position.  Anterior chamber wash-out was then carried out especially to clear the view.  This was carried out with a paracentesis incision.  At this time vitrectomy instrumentation was used to clean out the lens material in the vitreous cavity as well as in the capsular bag.  The capsular bag was fragmented and did not offer enough support, so a planned anterior chamber intraocular lens was used.  Vitreous hemorrhage was cleared.  It was at this time that a large retinal tear was found near the ora serrata at the 6:30 position.  It was necessary to use retinal cryopexy to repair this retinal tear.  Focal laser photocoagulation was placed around this posterior margin.  At this time the vitreous cavity was now clear.  The anterior chamber was deepened with viscoelastic material.  A grooved limbal incision was then fashioned superiorly.  Anterior chamber intraocular lens, Bausch and Lomb model S122-UV ACIOL, power +16.0, serial number [id number], was placed into the anterior chamber angle and supported on the scleral spur and rotated  to the horizontal position.  A small peripheral iridectomy was then fashioned.  At this time the limbal wound was closed with interrupted 2-0 nylon sutures.  The viscoelastic was washed away from the anterior chamber. At this time the superior sclerotomy was then closed.  The infusion removed and  similarly closed with 7-0 Vicryl suture.  Conjunctiva closed with 7-0 Vicryl suture.  Subconjunctival injection of antibiotic and steroid applied. Intraocular pressure was assessed and found to be adequate.  The patient tolerated the procedure well without complication.  A sterile patch and Fox shield were applied, and she was taken to the recovery room in good and stable condition. Dictated by:   Ernesto Rutherford, M.D. Attending:  Ernesto Rutherford, M.D. DD:  10/16/01 TD:  10/18/01 Job: [id number]

## 2010-08-21 NOTE — Op Note (Signed)
NAME:  Wendy Garner, Wendy Garner                      ACCOUNT NO.:  [id number]   MEDICAL RECORD NO.:  [id number]                   PATIENT TYPE:  AMB   LOCATION:  DAY                                  FACILITY:  [facility]   PHYSICIAN:  Lionel December, M.D.                 DATE OF BIRTH:  09-30-1915   DATE OF PROCEDURE:  05/10/2002  DATE OF DISCHARGE:                                 OPERATIVE REPORT   PROCEDURE:  Esophagogastroduodenoscopy with esophageal dilatation.   INDICATIONS FOR PROCEDURE:  The patient is an 77 year old Caucasian female  who was recently found to have profound microcytic anemia by Dr. Sherwood Gambler.  Her  H&H was 6.6 and 25.8, MCV 69.7.  She was begun on iron and feels a lot  better.  She did not receive any PRBCs.  She does have dysphagia and takes  Aleve fairly regularly.  Her history is also significant for colonic  resection in 10/2000 for colon carcinoma.  Her last surveillance colonoscopy  in 06/2001 by Dr. Victorino Dike was normal.   The procedure was reviewed with the patient, and informed consent was  obtained.   PREOPERATIVE MEDICATIONS:  Cetacaine spray for pharyngeal topical  anesthesia, Demerol 25 mg IV, Versed 3 mg in divided doses.   INSTRUMENT USED:  Olympus video system.   FINDINGS:  The procedure was performed in the endoscopy suite.  The  patient's vital signs and O2 saturations were monitored during the procedure  and remained stable.  The patient was placed in the left lateral recumbent  position.  The endoscope was passed via the oropharynx without any  difficulty into the esophagus.   Esophagus.  The mucosa of the esophagus was normal.  She had a tight  Schatzki's ring.  It was located at 34 cm from the incisors.  She had a  moderate-size sliding hiatal hernia.  She had three linear ulcers right at  the level of the diaphragmatic hiatus (Cameron lesions).  She had a fourth  small ulcer just below the diaphragmatic hiatus.  There was no active  bleeding  from these.   Stomach.  It was empty and distended with insufflation.  The folds of the  proximal stomach were normal.  Tiny prepyloric erosions were noted.  The  pyloric channel was patent.  The angularis was normal.  The fundus and  cardia were seen on retroflex view, and these ulcers were once again well  seen.  One was covered with fresh blood because of some friability.  This  ulcer was washed, but there was no active bleeding noted.  Therefore, no  intervention was undertaken.   Duodenum.  Examination of the bulb revealed normal mucosa.  The mucosa,  folds, and second and third part of the duodenum were normal.  The endoscope  was withdrawn.   Esophageal dilatation was performed by passing a 56 French Maloney dilator  through the esophagus completely.  After dilatation was  complete, the  endoscope was passed again and the area was noted to have been effectively  disrupted.  Pictures were taken for the record.  The endoscope was  withdrawn.  The patient tolerated the procedure well.   FINAL DIAGNOSES:  1. High-grade Schatski's ring which was disrupted by passing a 56 [place]     Maloney dilator.  2. Moderate-size sliding hiatal hernia with multiple ulcers at the level of     the diaphragmatic hiatus (Cameron lesions).  Suspect this is the source     of her chronic gastrointestinal blood loss.  3. Prepyloric antral erosions and antral gastritis.  This is secondary to     Helicobacter pylori gastritis.  Helicobacter pylori serology recently was     positive.   RECOMMENDATIONS:  1. H&H will be checked today.  2. Antireflux measures.  3. Will start her on Prilosec 20 mg p.o. q.a.m.  4. The patient can take Tylenol up to 2 g per day.  She is advised not to     take aspirin or other NSAIDS for now.  5. Will hold off therapy for a couple of weeks.  Will initiate therapy when     her hemoglobin is up to 10 g or so.                                               Lionel December,  M.D.    NR/MEDQ  D:  05/10/2002  T:  05/10/2002  Job:  [id number]   cc:   Madelin Rear. Sherwood Gambler, M.D.  [address]  [place]  [place] [address]  Fax: [phone]   Ulyess Mort, M.D. [facility]

## 2010-08-21 NOTE — Procedures (Signed)
NAME:  Wendy Garner, Wendy Garner          ACCOUNT NO.:  [id number]   MEDICAL RECORD NO.:  [id number]          PATIENT TYPE:  INP   LOCATION:  A218                          FACILITY:  [facility]   PHYSICIAN:  [place] Bing, M.D.  DATE OF BIRTH:  Dec 22, 1915   DATE OF PROCEDURE:  10/16/2004  DATE OF DISCHARGE:  10/17/2004                                  ECHOCARDIOGRAM   PROCEDURE:  Echocardiogram.   CARDIOLOGIST:  [place] Bing, M.D.   REFERRING PHYSICIAN:  Kirk Ruths, M.D.   CLINICAL DATA:  A 77 year old woman with SVT and ventricular tachycardia.   FINDINGS:  M-mode 2.6, left atrium 4.2, septum 1.6, posterior wall 1.3, LV  diastole 3.0, LV systole 1.7.   RESULTS:  1.  Technically suboptimal but adequate echocardiographic study.  2.  Very mild left atrial enlargement; mild right atrial enlargement.  3.  Normal right ventricular size; borderline hypertrophy; normal function.  4.  Normal tricuspid valve.  5.  Mild aortic valvular sclerosis; mild annular calcification.  6.  Slight mitral valve thickening with mild annular calcification and flat      leaflet coaptation but no definite prolapse.  7.  Normal left ventricular size; mild hypertrophy with prominent proximal      septal thickening. Normal regional and global LV systolic function.  8.  Normal IVC.       RR/MEDQ  D:  10/18/2004  T:  10/19/2004  Job:  [id number]

## 2010-08-21 NOTE — Discharge Summary (Signed)
[place]. [facility]  Patient:    Wendy Garner, Wendy Garner                   MRN: [id number] Adm. Date:  [id number] Disc. Date: [id number] Attending:  Meredith Leeds                           Discharge Summary  HISTORY OF PRESENT ILLNESS:  The patient is a 77 year old white female known to be anemic.  She was found on CT scan to have a large tumor of the right colon.  A sigmoidoscopy demonstrated no distal lesions.  She took a bowel prep at home and is brought to the hospital for colectomy.  Her health is excellent.  See the history and physical for details.  HOSPITAL COURSE:  On the day of admission, the patient underwent a right colectomy with anastomosis of the ileum to transverse colon.  She tolerated the operation and anesthetic well and had no postoperative infections or GI complications.  The tumor was found to be T3, N0.  She was felt to have a good prognosis.  By October 15, 2000, she had no complaints.  She was eating and her bowels were moving.  She was sent home that date and arrangements were made for follow-up in the office.  DIAGNOSIS:  Carcinoma of the right colon.  OPERATIONS:  Right colectomy with anastomosis.  DISCHARGE CONDITION:  Improved. DD:  10/25/00 TD:  10/26/00 Job: [id number]

## 2010-08-21 NOTE — H&P (Signed)
[place]. [facility]  Patient:    Wendy Garner, Wendy Garner                   MRN: [id number] Adm. Date:  [id number] Attending:  Meredith Leeds                         History and Physical  CHIEF COMPLAINT: Colon cancer.  HISTORY OF PRESENT ILLNESS: The patient is an 77 year old white female, who was found on screening laboratories to be anemic.  She had had several episodes of very unpleasant gassy sensation associated with nausea.  CT scan of her abdomen demonstrated a large tumor of the right colon.  There was no evident obstruction at the time of that examination.  There was suspicion of lymph node metastasis but no evidence of liver metastasis.  She is referred to me for that problem.  There is no previous history of GI problems.  The patient underwent sigmoidoscopy, demonstrating no distal colon tumor.  She is brought into the hospital for colectomy.  She accepts the risks.  She took outpatient prep and accomplished all of the instructions with clearing of her stool nicely, no problems with it.  PAST MEDICAL HISTORY: She is excellent health.  Her past medical illnesses are very unremarkable.  MEDICATIONS: None.  ALLERGIES: No known medication allergies.  SOCIAL HISTORY: She does not smoke.  She does not drink any form of alcohol. She is gravida 2 para 2, children 58 and 66 years old.  PAST SURGICAL HISTORY:  1. Cholecystectomy.  2. Tonsillectomy.  3. Breast biopsy.  REVIEW OF SYSTEMS: In detail, unremarkable.  FAMILY HISTORY/CHILDHOOD ILLNESSES: Unremarkable.  PHYSICAL EXAMINATION:  GENERAL: The patient is a very pleasant, elderly white female.  She is healthy in appearance.  VITAL SIGNS: Normal as recorded by nursing staff.  HEENT/NECK: The head, neck, eyes, ears, nose, mouth, and throat unremarkable.  CHEST: Clear to auscultation.  BREAST: Normal.  There is a biopsy scar.  CARDIAC: Heart rate and rhythm normal.  No murmur or  gallop.  ABDOMEN: No masses noted.  There is very slight tenderness in the right upper quadrant.  No organomegaly.  No hernia noted.  RECTAL: Unremarkable.  VAGINAL: Unremarkable.  EXTREMITIES: Normal.  LYMPH NODES: None enlarged in groins, axilla, or neck.  SKIN: No lesions noted.  MENTAL STATUS: Normal.  NEUROLOGIC: Normal.  IMPRESSION: Carcinoma of the right colon with possible lymph node metastasis.  PLAN: Right colectomy.  The patient accepts the risks of infection, bleeding, anesthetic complications, pulmonary complications, and other unmentioned complications.  She desires to proceed with the procedure. DD:  10/10/00 TD:  10/10/00 Job: [id number]

## 2010-08-21 NOTE — Consult Note (Signed)
NAME:  Wendy Garner, Wendy Garner          ACCOUNT NO.:  [id number]   MEDICAL RECORD NO.:  [id number]          PATIENT TYPE:  INP   LOCATION:  A218                          FACILITY:  [facility]   PHYSICIAN:  [place] Bing, M.D.  DATE OF BIRTH:  1915/05/18   DATE OF CONSULTATION:  10/16/2004  DATE OF DISCHARGE:                                   CONSULTATION   REFERRING PHYSICIAN:  Dr. Regino Schultze.   PRIMARY CARE PHYSICIAN:  Dr. Sherwood Gambler   HISTORY OF PRESENT ILLNESS:  An 77 year old woman with no prior cardiac  history, admitted after presenting with PSVT.  Wendy Garner has enjoyed  generally excellent health.  She has not had hypertension nor other  cardiovascular risk factors.  She is active without chest discomfort or  significant dyspnea.  She developed palpitations with dizziness and near  syncope on the morning of admission and was found to be in PSVT at a heart  rate of 180 in the emergency department.  Adenosine at doses of 6 and 12 mg  reportedly had no effect.  Intravenous diltiazem resulted in slowing of her  heart rate.  There are no rhythm strips for review.  She has felt well since  her heart rate decreased.   Past medical history is notable for macular degeneration and resection of  colon cancer 4 years ago.  She was treated for upper GI bleeding due to  ulcer disease 2 years ago.  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  Past surgeries have included cholecystectomy, tonsillectomy, and cataract  extraction.   She has no known allergies.  Recent medications include Nexium, vitamins.  and eye drops.   SOCIAL HISTORY:  Lives in [place]; retired from work with [employer].  Widowed with one daughter and one son.  No history of tobacco use  or excessive alcohol use.   FAMILY HISTORY:  Mother died at an advanced age; father suffered fatal CVA.  Four sisters, three died due to neoplastic disease.   REVIEW OF SYSTEMS:  Generally negative.  She has some mild chronic  dyspnea  on exertion.  She has not had significant palpitations in the past.  She has  intermittent pain of the hips and back.   PHYSICAL EXAMINATION:  GENERAL:  On exam, very pleasant, mentally sharp  elderly woman in no acute distress.  VITAL SIGNS:  The temperature is 98.9, heart rate 102, respirations 24,  blood pressure 105/60.  HEENT:  Anicteric sclerae; normal lids and conjunctiva.  NECK:  No jugular venous distension; normal carotid upstrokes without  bruits.  ENDOCRINE:  No thyromegaly.  HEMATOPOIETIC:  No cervical, axillary, or inguinal adenopathy.  LUNGS:  Clear.  CARDIAC:  Normal first and second heart sounds; fourth heart sound present.  ABDOMEN:  Soft and nontender; no organomegaly.  EXTREMITIES:  Normal distal pulses; no edema.  NEUROMUSCULAR:  Normal cranial nerves; symmetric strength and tone.  MUSCULOSKELETAL:  No joint deformities.   Chest x-ray:  Cardiomegaly; hiatal hernia.  EKG after cardioversion; sinus rhythm with frequent PVCs; minor residual  inferolateral ST-segment depression.  EKG during SVT:  Regular rhythm at a rate  of 180; prominent ST-segment  depression in the inferolateral leads; mildly delayed R-wave progression.  Subsequent EKG this afternoon:  Atrial flutter with 2:1 AV block.  Delayed R-  wave progression persists.  Rhythm strip: 12 beat run of ventricular  tachycardia.  Echocardiogram:  Mild left atrial enlargement; mild left ventricular  hypertrophy with septal predominance; normal regional and global systolic  function; no significant valvular abnormalities.  Other laboratory notable for normal CBC, potassium 3.5 with normal renal  function, glucose of 216, normal cardiac markers.   IMPRESSION:  Wendy Garner presents with probable underlying mild  hypertrophic cardiomyopathy and an intra-atrial reentrant rhythm with 1:1  conduction.  With intravenous diltiazem, she initially converted to sinus  rhythm, reverted to her atrial arrhythmia  but with 2:1 conduction and no  symptoms and now has once again converted to sinus rhythm.  Due to her  history of gastrointestinal bleeding, I am less inclined to treat her  initially with anticoagulants.  We will discontinue intravenous medicine and  start metoprolol at a dose of 25 mg t.i.d.  If this is well tolerated, she  can be discharged on Toprol 100 mg q.d.  Serial cardiac markers and thyroid  function studies are pending.  Assuming these are normal, she can be  discharged in the morning.  We will be happy to provide follow up in the  office.       RR/MEDQ  D:  10/16/2004  T:  10/16/2004  Job:  [id number]

## 2010-08-21 NOTE — H&P (Signed)
NAME:  Wendy Garner, Wendy Garner          ACCOUNT NO.:  [id number]   MEDICAL RECORD NO.:  [id number]          PATIENT TYPE:  INP   LOCATION:  A218                          FACILITY:  [facility]   PHYSICIAN:  Kirk Ruths, M.D.DATE OF BIRTH:  12-29-1915   DATE OF ADMISSION:  10/16/2004  DATE OF DISCHARGE:  LH                                HISTORY & PHYSICAL   CHIEF COMPLAINT:  Lightheadedness and weakness.   HISTORY OF PRESENT ILLNESS:  This is an 77 year old female who has been in  relatively good health until today.  She woke up feeling some palpitations  and lightheadedness.  In the emergency room she was found to have  supraventricular tachycardia, rate of approximately 180 per minute.  The  patient has no prior cardiac history.  The patient in the ER was given  adenosine IV without significant change, a bolus of Cardizem and then a  Cardizem drip.  The patient's rate was controlled was controlled  approximately at around 100 a minute at that time.  Initial cardiac enzymes,  CBC, met-7 and chest x-ray were normal.  She was admitted with  supraventricular tachycardia.   PAST MEDICAL HISTORY:  She has no known allergies.  She currently takes  Nexium for GERD and a multivitamin.  The patient is status post colon cancer  4 years ago, in remission.  She has had cataract surgery as well as  cholecystectomy and tonsillectomy.  Her GERD is followed by Dr. Karilyn Cota.   REVIEW OF SYSTEMS:  She denies chest pain, nausea, vomiting, shortness of  breath.   SOCIAL HISTORY:  She is a nonsmoker, nondrinker.   PHYSICAL EXAMINATION:  GENERAL APPEARANCE:  An elderly female who is alert  and oriented, in no distress.  VITAL SIGNS:  Blood pressure is 130/80, pulse at this time 120 and regular,  respirations 20 and unlabored.  O2 saturations are 98%.  HEENT:  The TMs are normal.  The pupils are equal, round and reactive to  light and accommodation.  The oropharynx is benign.  NECK:  Supple without JVD,  bruit or thyromegaly.  LUNGS:  Clear in all areas.  HEART:  Regular sinus rhythm with the above-mentioned tachycardia.  No  murmur, gallop or rub.  ABDOMEN:  Soft and nontender.  EXTREMITIES:  Without clubbing, cyanosis or edema.  NEUROLOGIC:  Exam is grossly intact.   ASSESSMENT:  1.  Supraventricular tachycardia.  2.  A history of colon cancer.  3.  Status post cholecystectomy and tonsillectomy.       WMM/MEDQ  D:  10/16/2004  T:  10/16/2004  Job:  [id number]

## 2010-08-21 NOTE — Discharge Summary (Signed)
NAME:  Wendy Garner, Wendy Garner          ACCOUNT NO.:  [id number]   MEDICAL RECORD NO.:  [id number]          PATIENT TYPE:  INP   LOCATION:  A218                          FACILITY:  [facility]   PHYSICIAN:  Kirk Ruths, M.D.DATE OF BIRTH:  [birth year]   DATE OF ADMISSION:  10/16/2004  DATE OF DISCHARGE:  07/15/2006LH                                 DISCHARGE SUMMARY   DISCHARGE DIAGNOSES:  1.  Supraventricular tachycardia with brief runs of atrial flutter,      supraventricular tachycardia and atrial fibrillation.  2.  History of colon cancer x4 years, status post history of peptic ulcer      disease with bleeding.  3.  Arthritis of hip and spine with macular degeneration.   HOSPITAL COURSE:  This is an 77 year old female who was admitted with  weakness and dizziness.  In the ER, she was found to have supraventricular  tachycardia of 180.  She was unresponsive to adenosine IV.  She was placed  on Cardizem drip after a bolus with improvement in heart rate.  The patient  was admitted to the floor where she was seen by cardiology.  At that time,  the patient had converted to regular rate and her Cardizem drip was  discontinued.  Meanwhile, she had a short run of atrial flutter and atrial  fibrillation with asymptomatic short run of ventricular tachycardia.  The  patient's labs showed white count of 7600, hemoglobin 15.5.  Electrolytes  are within normal limits with sodium being 132 initially, and 138 on repeat,  potassium 3.9.  The patient's calcium and magnesium were within normal  range.  The patient's serial cardiac enzymes were equivocal, although  negative.  CPK ranged from 43-61 in total, MB went from 1.7 to 8.1 and 7.0,  which is not significant in light of the low total CPK, and troponins did  elevate to 0.96.  Again, this was felt to be related to the SVT and  arrhythmia not significant for MI.  The patient's thyroid studies were  normal at 8.1 T4 and a TSH of 1.15.  The patient's  chest x-ray showed some  cardiomegaly and a hiatal hernia.  The patient was in normal sinus rhythm  since the evening before discharge.  She has been totally asymptomatic.  She  is fine and tolerating a regular diet.   DISCHARGE MEDICATIONS:  1.  Nexium.  2.  Multivitamin eye drops.  3.  Toprol 50 mg daily.   CONDITION ON DISCHARGE:  Stable.   FOLLOW UP:  She will be followed by [facility] as needed.       WMM/MEDQ  D:  10/17/2004  T:  10/17/2004  Job:  [id number]

## 2010-08-25 ENCOUNTER — Ambulatory Visit (INDEPENDENT_AMBULATORY_CARE_PROVIDER_SITE_OTHER): Payer: Medicare Other | Admitting: Internal Medicine

## 2010-08-25 ENCOUNTER — Encounter: Payer: Self-pay | Admitting: *Deleted

## 2010-08-25 ENCOUNTER — Encounter: Payer: Self-pay | Admitting: Internal Medicine

## 2010-08-25 DIAGNOSIS — Z950 Presence of cardiac pacemaker: Secondary | ICD-10-CM

## 2010-08-25 DIAGNOSIS — Z7901 Long term (current) use of anticoagulants: Secondary | ICD-10-CM

## 2010-08-25 DIAGNOSIS — I4892 Unspecified atrial flutter: Secondary | ICD-10-CM

## 2010-08-25 DIAGNOSIS — R0600 Dyspnea, unspecified: Secondary | ICD-10-CM

## 2010-08-25 DIAGNOSIS — I5033 Acute on chronic diastolic (congestive) heart failure: Secondary | ICD-10-CM | POA: Insufficient documentation

## 2010-08-25 DIAGNOSIS — I495 Sick sinus syndrome: Secondary | ICD-10-CM

## 2010-08-25 DIAGNOSIS — I5032 Chronic diastolic (congestive) heart failure: Secondary | ICD-10-CM

## 2010-08-25 DIAGNOSIS — R0989 Other specified symptoms and signs involving the circulatory and respiratory systems: Secondary | ICD-10-CM

## 2010-08-25 NOTE — Progress Notes (Signed)
HPI Wendy Garner returns today for followup. She is a pleasant elderly woman with a history of left atrial flutter documented at EP study. The patient was initially placed on amiodarone. At 200 mg a day she had evidence of side effects from her amiodarone. We reduced her dose and her symptoms improved. When I saw her several weeks ago, she was maintaining sinus rhythm and felt well. Short with thereafter, she began to experience worsening palpitations and shortness of breath. She has syncope. She denies chest pain. She has minimal peripheral edema. She denies noncompliance with her medications. Not on File   Current Outpatient Prescriptions  Medication Sig Dispense Refill  . amiodarone (PACERONE) 200 MG tablet Take 200 mg by mouth daily. TAKE 1/2 TAB DAILY      . BROMDAY 0.09 % (DAILY) SOLN 1 drop as needed.       . calcium carbonate (OS-CAL) 600 MG TABS Take 600 mg by mouth daily.        Marland Kitchen esomeprazole (NEXIUM) 40 MG capsule Take 40 mg by mouth daily before breakfast.        . metoprolol (TOPROL-XL) 50 MG 24 hr tablet Take 50 mg by mouth daily.       . Multiple Vitamins-Minerals (MULTIVITAMIN WITH MINERALS) tablet Take 1 tablet by mouth daily.        . Multiple Vitamins-Minerals (VISION VITAMINS PO) Take 1 tablet by mouth daily.        Marland Kitchen SYNTHROID 125 MCG tablet Take 125 mcg by mouth daily.       Marland Kitchen warfarin (COUMADIN) 3 MG tablet Take 3 mg by mouth as directed.          Past Medical History  Diagnosis Date  . Sick sinus syndrome     Paroxysmal atrial fibrillation with a rapid ventricular response;   Marland Kitchen Macular degeneration   . Peptic ulcer disease     with history of upper GI bleeding in 2004  . Degenerative joint disease     hip and lumbosacral spine  . Adenocarcinoma of colon   . Atrial fibrillation     ROS:   All systems reviewed and negative except as noted in the HPI.   Past Surgical History  Procedure Date  . Cataract extraction   . Cholecystectomy   . Tonsillectomy     . Partial colectomy 2002    Partial colectomy for carcinoma of the colon-2002     No family history on file.   History   Social History  . Marital Status: Widowed    Spouse Name: N/A    Number of Children: 2  . Years of Education: N/A   Occupational History  . RETIRED     [employer]   Social History Main Topics  . Smoking status: Never Smoker   . Smokeless tobacco: Never Used  . Alcohol Use: No  . Drug Use: No  . Sexually Active: Not on file   Other Topics Concern  . Not on file   Social History Narrative  . No narrative on file     BP 134/84  Pulse 105  Wt 135 lb (61.236 kg)  Physical Exam:  Elderly,well appearing NAD HEENT: Unremarkable Neck:  No JVD, no thyromegally Lymphatics:  No adenopathy Back:  No CVA tenderness Lungs:  Clear. Well-healed pacemaker incision. HEART:  Regular rate rhythm, no murmurs, no rubs, no clicks Abd:  Flat, positive bowel sounds, no organomegally, no rebound, no guarding Ext:  2 plus pulses, no edema, no  cyanosis, no clubbing Skin:  No rashes no nodules Neuro:  CN II through XII intact, motor grossly intact  DEVICE  Normal device function.  See PaceArt for details. Underlying rhythm is atrial flutter.  Assess/Plan:

## 2010-08-25 NOTE — Patient Instructions (Signed)
Your physician recommends that you schedule a follow-up appointment in: after procedure Your physician has recommended that you have an ablation. Catheter ablation is a medical procedure used to treat some cardiac arrhythmias (irregular heartbeats). During catheter ablation, a long, thin, flexible tube is put into a blood vessel in your groin (upper thigh), or neck. This tube is called an ablation catheter. It is then guided to your heart through the blood vessel. Radio frequency waves destroy small areas of heart tissue where abnormal heartbeats may cause an arrhythmia to start. Please see the instruction sheet given to you today.  Your physician has recommended that you have a Cardioversion (DCCV). Electrical Cardioversion uses a jolt of electricity to your heart either through paddles or wired patches attached to your chest. This is a controlled, usually prescheduled, procedure. Defibrillation is done under light anesthesia in the hospital, and you usually go home the day of the procedure. This is done to get your heart back into a normal rhythm. You are not awake for the procedure. Please see the instruction sheet given to you today.

## 2010-08-25 NOTE — Assessment & Plan Note (Signed)
Her device is working normally. It demonstrates however that she is out of rhythm much of the time.

## 2010-08-25 NOTE — Assessment & Plan Note (Signed)
Her problem is quite difficult. She is unable to tolerate increasing doses of amiodarone. On low-dose amiodarone, she is now in atrial flutter. She is quite symptomatic with this. I discussed the treatment was with the patient. I recommended AV node ablation. Prior to doing this procedure, I would like her to be therapeutic with her anticoagulation for approximately 3 weeks. At the time of her ablation I would try to cardiovert her.

## 2010-08-25 NOTE — Assessment & Plan Note (Signed)
When the patient is out of rhythm or heart failure symptoms are much worse. She goes to class I when she is in rhythm to class 2-3 when she is out of rhythm. We'll try to control her ventricular rate with AV node ablation. Additional treatment options will be discussed as needed.

## 2010-09-01 NOTE — Telephone Encounter (Signed)
I am not sure how appropriate it is to do a screening colonoscopy at age 77. I would defer to her physician. She would be a small risk for thromboembolic complications by coming off of her anticoagulation

## 2010-09-02 ENCOUNTER — Other Ambulatory Visit: Payer: Self-pay | Admitting: Internal Medicine

## 2010-09-03 ENCOUNTER — Ambulatory Visit (HOSPITAL_COMMUNITY)
Admission: RE | Admit: 2010-09-03 | Discharge: 2010-09-03 | Disposition: A | Discharge status: Home / Self Care | Payer: Medicare Other | Source: Ambulatory Visit | Attending: Internal Medicine | Admitting: Internal Medicine

## 2010-09-03 ENCOUNTER — Ambulatory Visit (INDEPENDENT_AMBULATORY_CARE_PROVIDER_SITE_OTHER): Payer: Medicare Other | Admitting: *Deleted

## 2010-09-03 DIAGNOSIS — Z7901 Long term (current) use of anticoagulants: Secondary | ICD-10-CM

## 2010-09-03 DIAGNOSIS — J4489 Other specified chronic obstructive pulmonary disease: Secondary | ICD-10-CM | POA: Insufficient documentation

## 2010-09-03 DIAGNOSIS — R000 Tachycardia, unspecified: Secondary | ICD-10-CM | POA: Insufficient documentation

## 2010-09-03 DIAGNOSIS — R0602 Shortness of breath: Secondary | ICD-10-CM | POA: Insufficient documentation

## 2010-09-03 DIAGNOSIS — I4892 Unspecified atrial flutter: Secondary | ICD-10-CM

## 2010-09-03 DIAGNOSIS — J449 Chronic obstructive pulmonary disease, unspecified: Secondary | ICD-10-CM | POA: Insufficient documentation

## 2010-09-03 LAB — POCT INR: INR: 1.9

## 2010-09-06 NOTE — Op Note (Signed)
  NAME:  Wendy Garner, Wendy Garner          ACCOUNT NO.:  [id number]  MEDICAL RECORD NO.:  [id number]           PATIENT TYPE:  O  LOCATION:  DAYP                          FACILITY:  [facility]  PHYSICIAN:  Lionel December, M.D.    DATE OF BIRTH:  07-07-1915  DATE OF PROCEDURE:  08/21/2010 DATE OF DISCHARGE:                              OPERATIVE REPORT   PROCEDURE:  Colonoscopy.  INDICATION:  [name] is a 77 year old Caucasian female with history of colon carcinoma status post right hemicolectomy in January 2000 was last, surveillance colonoscopy was in March 2003 who presents with 2 episodes of rectal bleeding that occurred recently.  The patient is chronically anticoagulated for atrial fibrillation.  She is undergoing diagnostic colonoscopy.  Procedure risks were reviewed with the patient and informed consent was obtained.  MEDS FOR CONSCIOUS SEDATION:  Demerol 15 mg IV, Versed 2 mg IV.  FINDINGS:  Procedure performed in endoscopy suite.  The patient's vital signs and O2 sat were monitored during the procedure and remained stable.  The patient was placed in left lateral recumbent position. Rectal examination performed.  No abnormality noted on external or digital exam.  Pentax videoscope was placed through rectum and advanced under vision into sigmoid colon.  She had multiple diverticula predominately sigmoid and descending colon and a few more scattered at transverse colon.  Preparation was excellent.  Scope was passed in the area of hepatic flexure where wide open ileocolonic anastomosis was identified.  Pictures were taken for the record.  As the scope was withdrawn, colonic mucosa was examined second time and no polyps, tumor masses or AV malformations were noted.  Rectal mucosa similarly was normal.  Scope was retroflexed to examine anorectal junction and small hemorrhoids noted below the dentate line.  Endoscope was then withdrawn. Withdrawal time was 5 minutes.  The patient tolerated  the procedure well.  FINAL DIAGNOSIS: 1. Examination performed to ileocolonic anastomosis located in the     region of hepatic flexure. 2. No evidence of recurrent tumor or polyps. 3. Pancolonic diverticulosis. 4. External hemorrhoids. 5. Suspect she may have experienced diverticular bleed.  RECOMMENDATIONS: 1. The patient will resume her warfarin at the usual dose and get her     INR checked in 10-14 days. 2. We will check her H and H today. 3. High-fiber diet and fiber supplement 3-4 grams daily. 4. Should she experience another episode of rectal bleeding, she     should report to the emergency room immediately.          ______________________________ Lionel December, M.D.     NR/MEDQ  D:  08/21/2010  T:  08/21/2010  Job:  [id number]  cc:   Corrie Mckusick, M.D. Fax: [phone]  Doylene Canning. Ladona Ridgel, MD [address]  [address] [place] [address]  Electronically Signed by Lionel December M.D. on 09/06/2010 01:18:55 PM

## 2010-09-07 ENCOUNTER — Ambulatory Visit (HOSPITAL_COMMUNITY)
Admission: RE | Admit: 2010-09-07 | Discharge: 2010-09-08 | Disposition: A | Discharge status: Home / Self Care | Payer: Medicare Other | Source: Ambulatory Visit | Attending: Internal Medicine | Admitting: Internal Medicine

## 2010-09-07 ENCOUNTER — Inpatient Hospital Stay (HOSPITAL_BASED_OUTPATIENT_CLINIC_OR_DEPARTMENT_OTHER): Admission: RE | Admit: 2010-09-07 | Payer: Medicare Other | Source: Ambulatory Visit | Admitting: Cardiology

## 2010-09-07 DIAGNOSIS — Z8711 Personal history of peptic ulcer disease: Secondary | ICD-10-CM | POA: Insufficient documentation

## 2010-09-07 DIAGNOSIS — Z79899 Other long term (current) drug therapy: Secondary | ICD-10-CM | POA: Insufficient documentation

## 2010-09-07 DIAGNOSIS — I4891 Unspecified atrial fibrillation: Secondary | ICD-10-CM

## 2010-09-07 DIAGNOSIS — M1990 Unspecified osteoarthritis, unspecified site: Secondary | ICD-10-CM | POA: Insufficient documentation

## 2010-09-07 DIAGNOSIS — Z7901 Long term (current) use of anticoagulants: Secondary | ICD-10-CM | POA: Insufficient documentation

## 2010-09-07 LAB — APTT: aPTT: 38 seconds — ABNORMAL HIGH (ref 24–37)

## 2010-09-14 ENCOUNTER — Ambulatory Visit (INDEPENDENT_AMBULATORY_CARE_PROVIDER_SITE_OTHER): Payer: Medicare Other | Admitting: *Deleted

## 2010-09-14 DIAGNOSIS — I4892 Unspecified atrial flutter: Secondary | ICD-10-CM

## 2010-09-14 DIAGNOSIS — Z7901 Long term (current) use of anticoagulants: Secondary | ICD-10-CM

## 2010-09-14 MED ORDER — WARFARIN SODIUM 3 MG PO TABS: 3.0000 mg | ORAL_TABLET | ORAL | Status: DC

## 2010-09-16 ENCOUNTER — Encounter: Payer: Self-pay | Admitting: Internal Medicine

## 2010-09-21 ENCOUNTER — Ambulatory Visit (INDEPENDENT_AMBULATORY_CARE_PROVIDER_SITE_OTHER): Payer: Medicare Other | Admitting: *Deleted

## 2010-09-21 DIAGNOSIS — I4892 Unspecified atrial flutter: Secondary | ICD-10-CM

## 2010-09-21 DIAGNOSIS — Z7901 Long term (current) use of anticoagulants: Secondary | ICD-10-CM

## 2010-09-21 LAB — POCT INR: INR: 2.9

## 2010-09-22 ENCOUNTER — Ambulatory Visit (INDEPENDENT_AMBULATORY_CARE_PROVIDER_SITE_OTHER): Payer: Medicare Other | Admitting: Internal Medicine

## 2010-09-22 ENCOUNTER — Encounter: Payer: Self-pay | Admitting: Internal Medicine

## 2010-09-22 DIAGNOSIS — I495 Sick sinus syndrome: Secondary | ICD-10-CM

## 2010-09-22 DIAGNOSIS — I509 Heart failure, unspecified: Secondary | ICD-10-CM

## 2010-09-22 DIAGNOSIS — I5032 Chronic diastolic (congestive) heart failure: Secondary | ICD-10-CM

## 2010-09-22 DIAGNOSIS — Z950 Presence of cardiac pacemaker: Secondary | ICD-10-CM

## 2010-09-22 DIAGNOSIS — I4892 Unspecified atrial flutter: Secondary | ICD-10-CM

## 2010-09-22 MED ORDER — POTASSIUM CHLORIDE 10 MEQ PO TBCR: 10.0000 meq | EXTENDED_RELEASE_TABLET | Freq: Every day | ORAL | Status: DC

## 2010-09-22 MED ORDER — FUROSEMIDE 20 MG PO TABS: 20.0000 mg | ORAL_TABLET | Freq: Every day | ORAL | Status: DC

## 2010-09-22 NOTE — Patient Instructions (Signed)
Your physician recommends that you schedule a follow-up appointment in: 2 months  Your physician recommends that you return for lab work in: 1 week (BMP &  INR)   Your physician has recommended you make the following change in your medication: start furosemide 20 mg daily and start Potassium 10 mEq daily

## 2010-09-23 ENCOUNTER — Encounter: Payer: Medicare Other | Admitting: *Deleted

## 2010-09-23 ENCOUNTER — Encounter: Payer: Self-pay | Admitting: Internal Medicine

## 2010-09-23 NOTE — Progress Notes (Signed)
HPI Wendy Garner returns today for followup. She is a 77 year old woman with a history of left atrial flutter, symptomatic tachybradycardia syndrome, status post permanent pacemaker insertion. The patient was cardioverted initially and placed on amiodarone. However she became intolerant of amiodarone and this was discontinued. Her atrial arrhythmias returned. Her rate was not well controlled. She is subsequently undergone AV node ablation. Prior to the procedure in addition to shortness of breath, the patient had substernal chest discomfort. Since her ablation, the chest discomfort has resolved. She does continue to have dyspnea particularly with exertion but not at rest.  No Known Allergies   Current Outpatient Prescriptions  Medication Sig Dispense Refill  . BROMDAY 0.09 % (DAILY) SOLN 1 drop as needed.       . calcium carbonate (OS-CAL) 600 MG TABS Take 600 mg by mouth daily.        Marland Kitchen esomeprazole (NEXIUM) 40 MG capsule Take 40 mg by mouth daily before breakfast.        . metoprolol (TOPROL-XL) 50 MG 24 hr tablet TAKE ONE (1) TABLET EACH DAY  30 tablet  6  . Multiple Vitamins-Minerals (MULTIVITAMIN WITH MINERALS) tablet Take 1 tablet by mouth daily.        . Multiple Vitamins-Minerals (VISION VITAMINS PO) Take 1 tablet by mouth daily.        . potassium chloride (KLOR-CON) 10 MEQ CR tablet Take 1 tablet (10 mEq total) by mouth daily.  30 tablet  11  . SYNTHROID 125 MCG tablet Take 125 mcg by mouth daily.       Marland Kitchen warfarin (COUMADIN) 3 MG tablet Take 1 tablet (3 mg total) by mouth as directed.  30 tablet  3  . furosemide (LASIX) 20 MG tablet Take 1 tablet (20 mg total) by mouth daily.  30 tablet  11     Past Medical History  Diagnosis Date  . Sick sinus syndrome     Paroxysmal atrial fibrillation with a rapid ventricular response;   Marland Kitchen Macular degeneration   . Peptic ulcer disease     with history of upper GI bleeding in 2004  . Degenerative joint disease     hip and lumbosacral spine    . Adenocarcinoma of colon   . Atrial fibrillation     ROS:   All systems reviewed and negative except as noted in the HPI.   Past Surgical History  Procedure Date  . Cataract extraction   . Cholecystectomy   . Tonsillectomy   . Partial colectomy 2002    Partial colectomy for carcinoma of the colon-2002     No family history on file.   History   Social History  . Marital Status: Widowed    Spouse Name: N/A    Number of Children: 2  . Years of Education: N/A   Occupational History  . RETIRED     [employer]   Social History Main Topics  . Smoking status: Never Smoker   . Smokeless tobacco: Never Used  . Alcohol Use: No  . Drug Use: No  . Sexually Active: Not on file   Other Topics Concern  . Not on file   Social History Narrative  . No narrative on file     BP 147/77  Pulse 79  Wt 137 lb (62.143 kg)  Physical Exam:  Elderly, Well appearing NAD HEENT: Unremarkable Neck:  No JVD, no thyromegally Lymphatics:  No adenopathy Back:  No CVA tenderness Lungs:  Clear. Well-healed pacemaker incision. HEART:  Regular rate rhythm, no murmurs, no rubs, no clicks Abd:  Flat, positive bowel sounds, no organomegally, no rebound, no guarding Ext:  2 plus pulses, no edema, no cyanosis, no clubbing Skin:  No rashes no nodules Neuro:  CN II through XII intact, motor grossly intact  DEVICE  Normal device function.  See PaceArt for details.   Assess/Plan:

## 2010-09-23 NOTE — Assessment & Plan Note (Signed)
Her device is working normally. Will recheck in several months. 

## 2010-09-23 NOTE — Assessment & Plan Note (Signed)
Her current symptoms are class II to class III. Her advanced age makes evaluation of her symptoms more difficult. I recommended that the patient begin taking diuretics to help reduce pulmonary congestion. I will see her back in several weeks to see how initiation of diuretic therapy has helped her. We'll plan to obtain labs to make sure that her potassium level is within satisfactory the limits.

## 2010-09-23 NOTE — Assessment & Plan Note (Signed)
Her ventricular rate appears to be well controlled now that she is status post AV node ablation.

## 2010-09-28 ENCOUNTER — Encounter: Payer: Medicare Other | Admitting: *Deleted

## 2010-09-30 ENCOUNTER — Other Ambulatory Visit: Payer: Self-pay | Admitting: Internal Medicine

## 2010-10-01 LAB — BASIC METABOLIC PANEL
BUN: 23 mg/dL (ref 6–23)
CO2: 26 mEq/L (ref 19–32)
Calcium: 8.7 mg/dL (ref 8.4–10.5)
Chloride: 104 mEq/L (ref 96–112)
Glucose, Bld: 143 mg/dL — ABNORMAL HIGH (ref 70–99)

## 2010-10-01 LAB — PROTIME-INR: Prothrombin Time: 26.1 seconds — ABNORMAL HIGH (ref 11.6–15.2)

## 2010-10-06 NOTE — Discharge Summary (Signed)
NAME:  Wendy Garner, Wendy Garner NO.:  [id number]  MEDICAL RECORD NO.:  [id number]  LOCATION:  2004                         FACILITY:  [facility]  PHYSICIAN:  Duke Salvia, MD, FACCDATE OF BIRTH:  21-Jun-1915  DATE OF ADMISSION:  09/07/2010 DATE OF DISCHARGE:  09/08/2010                              DISCHARGE SUMMARY   PRIMARY CARE PHYSICIAN:  Corrie Mckusick, MD  ELECTROPHYSIOLOGIST:  Doylene Canning. Ladona Ridgel, MD  PRIMARY DIAGNOSIS:  Symptomatic atrial fibrillation/flutter.  SECONDARY DIAGNOSES: 1. Sick sinus syndrome status post pacemaker. 2. Macular degeneration. 3. Peptic ulcer disease. 4. Degenerative joint disease.  ALLERGIES:  The patient has no known drug allergies.  PROCEDURES IN THIS ADMISSION:  AV node ablation on September 07, 2010, by Dr. Ladona Ridgel.  The study demonstrated successful catheter ablation and the patient's AV node, which resulted in complete heart block.  BRIEF HISTORY OF PRESENT ILLNESS:  Wendy Garner is a 77 year old female with a history of left atrial flutter documented EP study.  The patient was initially placed on amiodarone to help control her arrhythmias. However, she developed significant side effects and was unable to tolerate higher doses of this medication.  With reduced dosages, her symptoms improved.  However, she began having recurrent atrial arrhythmias on her decreased doses of amiodarone.  Because of this, further treatment options were discussed with the patient.  Dr. Ladona Ridgel recommended AV node ablation because of her inability to tolerate antiarrhythmic medications.  HOSPITAL COURSE:  The patient was admitted on September 07, 2010, with planned ablation of her AV node.  This was carried out by Dr. Ladona Ridgel with details as outlined above.  Her device was programmed VVI at a rate of 80 beats per minute per Dr. Ladona Ridgel.  The patient was monitored on telemetry overnight, which demonstrated ventricular pacing at 80 beats per minute.  She has  underlying atrial fibrillation.  Her groin incisions were without hematoma or bruit.  She was examined by Dr. Graciela Husbands on September 08, 2010, and considered her stable for discharge.  FOLLOWUP APPOINTMENTS: 1. [facility] in the [place] office on September 23, 2010, at 3:30 p.m.  At this time, her rate should be decreased     to 70 beats per minute. 2. Dr. Ladona Ridgel in [facility] office on October 12, 2010, at 10 a.m. 3. [facility] on September 14, 2010, at 3:50 p.m.  DISCHARGE INSTRUCTIONS: 1. Increase activity slowly. 2. Follow a low-sodium heart-healthy diet. 3. Keep groin incision clean and dry.  DISCHARGE MEDICATIONS: 1. Multivitamin daily. 2. Calcium carbonate plus vitamin D daily. 3. Nexium 40 mg daily. 4. Synthroid 125 mcg daily. 5. Toprol-XL 50 mg daily. 6. Warfarin 3 mg one-half tablet daily. 7. Amiodarone 200 mg one-half tablet daily. 8. OsteVit daily.  DISPOSITION:  The patient was seen and examined by Dr. Graciela Husbands on September 08, 2010, and considered stable for discharge.DURATION OF DISCHARGE ENCOUNTER:  35 minutes.     Gypsy Balsam, RN,BSN   ______________________________ Duke Salvia, MD, [facility]    AS/MEDQ  D:  09/08/2010  T:  09/08/2010  Job:  [id number]  Electronically Signed by Gypsy Balsam RNBSN on 09/11/2010 03:37:13 PM Electronically Signed by  Sherryl Manges MD [facility] on 10/06/2010 04:29:36 PM

## 2010-10-12 ENCOUNTER — Encounter: Payer: Medicare Other | Admitting: Internal Medicine

## 2010-10-19 ENCOUNTER — Ambulatory Visit (INDEPENDENT_AMBULATORY_CARE_PROVIDER_SITE_OTHER): Payer: Medicare Other | Admitting: *Deleted

## 2010-10-19 DIAGNOSIS — I4892 Unspecified atrial flutter: Secondary | ICD-10-CM

## 2010-10-19 DIAGNOSIS — I4891 Unspecified atrial fibrillation: Secondary | ICD-10-CM

## 2010-10-19 DIAGNOSIS — Z7901 Long term (current) use of anticoagulants: Secondary | ICD-10-CM

## 2010-10-22 NOTE — Op Note (Signed)
  NAME:  Wendy Garner, Wendy Garner NO.:  [id number]  MEDICAL RECORD NO.:  [id number]  LOCATION:  2004                         FACILITY:  [facility]  PHYSICIAN:  Doylene Canning. Ladona Ridgel, MD    DATE OF BIRTH:  Mar 31, 1916  DATE OF PROCEDURE:  09/07/2010 DATE OF DISCHARGE:                              OPERATIVE REPORT   PROCEDURE PERFORMED:  Atrioventricular node ablation.  INDICATIONS:  Symptomatic atrial fibrillation/flutter with a rapid ventricular response status post permanent pacemaker insertion.  INTRODUCTION:  The patient is a 77 year old woman with a history of atrial arrhythmias which have been symptomatic.  She failed amiodarone. She is now referred for AV node ablation in the setting of prior permanent pacemaker insertion.  PROCEDURE:  After informed consent was obtained, the patient was taken to the diagnostic EP lab in a fasting state.  After usual preparation and draping, intravenous fentanyl and midazolam was given for sedation. 30 mL of lidocaine was infiltrated into the right femoral region.  A 7- French quadripolar ablation catheter was inserted percutaneously by way of the right femoral vein and advanced up into the right atrium under fluoroscopic guidance.  Mapping was carried out.  The HV interval was 46 milliseconds.  Two RF energy applications were delivered to the AV node region resulting in creation of complete heart block.  The patient was observed for approximately 15 minutes after this and had no recurrent AV node conduction.  The pacemaker was reprogrammed to 80 beats per minute in the VVI mode and she was returned to her room in satisfactory condition.  COMPLICATIONS:  There were no immediate procedure complications.  RESULTS:  A.  Baseline ECG.  Baseline ECG demonstrates atrial fib/flutter with a rapid ventricular response. B.  Baseline intervals.  The HV interval was 46 milliseconds. C.  Mapping.  Mapping demonstrated atrial fib/flutter with a  rapid ventricular response. D.  RF energy application.  A total of 2 RF energy applications were delivered to the AV node region resulting in creation of complete heart block.  CONCLUSION:  The study demonstrates successful catheter ablation of the patient's AV node in a patient with atrial fibrillation/flutter and rapid ventricular response despite medical therapy.     Doylene Canning. Ladona Ridgel, MD    GWT/MEDQ  D:  09/07/2010  T:  09/07/2010  Job:  [id number]  Electronically Signed by Lewayne Bunting MD on 10/22/2010 08:31:29 AM

## 2010-10-22 NOTE — Discharge Summary (Signed)
  NAME:  Wendy Garner, [place] NO.:  [id number]  MEDICAL RECORD NO.:  [id number]  LOCATION:  2004                         FACILITY:  [facility]  PHYSICIAN:  Doylene Canning. Ladona Ridgel, MD    DATE OF BIRTH:  Nov 20, 1915  DATE OF ADMISSION:  09/07/2010 DATE OF DISCHARGE:  09/08/2010                              DISCHARGE SUMMARY   ADDENDUM  After discussion with Dr. Ladona Ridgel, the patient's medication list should be adjusted to reflect the following.  The patient will discontinue her amiodarone completely at this time.  The patient will also decrease her Toprol to one-half tablet daily per Dr. Lubertha Basque recommendations.     Gypsy Balsam, RN,BSN   ______________________________ Doylene Canning. Ladona Ridgel, MD    AS/MEDQ  D:  09/08/2010  T:  09/08/2010  Job:  [id number]  Electronically Signed by Gypsy Balsam RNBSN on 09/11/2010 03:37:24 PM Electronically Signed by Lewayne Bunting MD on 10/22/2010 08:31:32 AM

## 2010-11-02 ENCOUNTER — Ambulatory Visit (INDEPENDENT_AMBULATORY_CARE_PROVIDER_SITE_OTHER): Payer: Medicare Other | Admitting: *Deleted

## 2010-11-02 DIAGNOSIS — Z7901 Long term (current) use of anticoagulants: Secondary | ICD-10-CM

## 2010-11-02 DIAGNOSIS — I4892 Unspecified atrial flutter: Secondary | ICD-10-CM

## 2010-11-05 ENCOUNTER — Encounter: Payer: Self-pay | Admitting: Internal Medicine

## 2010-11-19 ENCOUNTER — Ambulatory Visit (INDEPENDENT_AMBULATORY_CARE_PROVIDER_SITE_OTHER): Payer: Medicare Other | Admitting: *Deleted

## 2010-11-19 DIAGNOSIS — I4892 Unspecified atrial flutter: Secondary | ICD-10-CM

## 2010-11-19 DIAGNOSIS — Z7901 Long term (current) use of anticoagulants: Secondary | ICD-10-CM

## 2010-11-25 ENCOUNTER — Ambulatory Visit (INDEPENDENT_AMBULATORY_CARE_PROVIDER_SITE_OTHER): Payer: Medicare Other | Admitting: Internal Medicine

## 2010-11-25 ENCOUNTER — Encounter: Payer: Self-pay | Admitting: Internal Medicine

## 2010-11-25 DIAGNOSIS — I4892 Unspecified atrial flutter: Secondary | ICD-10-CM

## 2010-11-25 DIAGNOSIS — I5032 Chronic diastolic (congestive) heart failure: Secondary | ICD-10-CM

## 2010-11-25 DIAGNOSIS — I495 Sick sinus syndrome: Secondary | ICD-10-CM

## 2010-11-25 DIAGNOSIS — I509 Heart failure, unspecified: Secondary | ICD-10-CM

## 2010-11-25 DIAGNOSIS — Z950 Presence of cardiac pacemaker: Secondary | ICD-10-CM

## 2010-11-25 NOTE — Assessment & Plan Note (Signed)
Her device is working normally. She will continue followup in several months.

## 2010-11-25 NOTE — Assessment & Plan Note (Signed)
Her symptoms are class 2. She will continue a low sodium diet and her diuretics and other meds.

## 2010-11-25 NOTE — Progress Notes (Signed)
HPI Wendy Garner returns today for followup. She is a pleasant elderly woman with a h/o atrial fib with an RVR, HTN, s/p AV node ablation and PPM. She has been stable since her procedure. She has very mild dyspnea but no palpitations. No syncope. No Known Allergies   Current Outpatient Prescriptions  Medication Sig Dispense Refill  . BROMDAY 0.09 % (DAILY) SOLN 1 drop as needed.       . calcium carbonate (OS-CAL) 600 MG TABS Take 600 mg by mouth daily.        Marland Kitchen esomeprazole (NEXIUM) 40 MG capsule Take 40 mg by mouth daily before breakfast.        . furosemide (LASIX) 20 MG tablet Take 1 tablet (20 mg total) by mouth daily.  30 tablet  11  . metoprolol (TOPROL-XL) 50 MG 24 hr tablet TAKE ONE (1) TABLET EACH DAY  30 tablet  6  . Multiple Vitamins-Minerals (MULTIVITAMIN WITH MINERALS) tablet Take 1 tablet by mouth daily.        . Multiple Vitamins-Minerals (VISION VITAMINS PO) Take 1 tablet by mouth daily.        . potassium chloride (KLOR-CON) 10 MEQ CR tablet Take 1 tablet (10 mEq total) by mouth daily.  30 tablet  11  . SYNTHROID 125 MCG tablet Take 125 mcg by mouth daily.       Marland Kitchen tetrahydrozoline 0.05 % ophthalmic solution as directed.        . warfarin (COUMADIN) 3 MG tablet Take 1 tablet (3 mg total) by mouth as directed.  30 tablet  3     Past Medical History  Diagnosis Date  . Macular degeneration   . Peptic ulcer disease     with history of upper GI bleeding in 2004  . Degenerative joint disease     hip and lumbosacral spine  . Adenocarcinoma of colon   . Sick sinus syndrome     Paroxysmal atrial fibrillation with a rapid ventricular response;   . Atrial fibrillation   . Bradycardia, severe sinus     Extreme; junctional bradycardia and sinus arrest up to 4 seconds    ROS:   All systems reviewed and negative except as noted in the HPI.   Past Surgical History  Procedure Date  . Cataract extraction   . Cholecystectomy   . Tonsillectomy   . Partial colectomy 2002   Partial colectomy for carcinoma of the colon-2002     Family History  Problem Relation Age of Onset  . Stroke Father      History   Social History  . Marital Status: Widowed    Spouse Name: N/A    Number of Children: 2  . Years of Education: N/A   Occupational History  . RETIRED     [employer]   Social History Main Topics  . Smoking status: Never Smoker   . Smokeless tobacco: Never Used  . Alcohol Use: No  . Drug Use: No  . Sexually Active: Not on file   Other Topics Concern  . Not on file   Social History Narrative   Resides in [place].     BP 136/77  Pulse 76  Resp 18  Ht 5\' 7"  (1.702 m)  Wt 129 lb 12.8 oz (58.877 kg)  BMI 20.33 kg/m2  SpO2 98%  Physical Exam:  Well appearing elderly woman NAD HEENT: Unremarkable Neck:  No JVD, no thyromegally Lymphatics:  No adenopathy Back:  No CVA tenderness Lungs:  Clear. Well healed  PPM incision. HEART:  Regular rate rhythm, no murmurs, no rubs, no clicks Abd:  soft, positive bowel sounds, no organomegally, no rebound, no guarding Ext:  2 plus pulses, no edema, no cyanosis, no clubbing Skin:  No rashes no nodules Neuro:  CN II through XII intact, motor grossly intact  DEVICE  Normal device function.  See PaceArt for details.   Assess/Plan:

## 2010-11-25 NOTE — Patient Instructions (Signed)
Your physician recommends that you schedule a follow-up appointment in: 6 months Dr. Ladona Ridgel and Gunnar Fusi

## 2010-12-10 ENCOUNTER — Ambulatory Visit (INDEPENDENT_AMBULATORY_CARE_PROVIDER_SITE_OTHER): Payer: Medicare Other | Admitting: *Deleted

## 2010-12-10 DIAGNOSIS — I4892 Unspecified atrial flutter: Secondary | ICD-10-CM

## 2010-12-10 DIAGNOSIS — Z7901 Long term (current) use of anticoagulants: Secondary | ICD-10-CM

## 2011-01-07 ENCOUNTER — Ambulatory Visit (INDEPENDENT_AMBULATORY_CARE_PROVIDER_SITE_OTHER): Payer: Medicare Other | Admitting: *Deleted

## 2011-01-07 DIAGNOSIS — Z7901 Long term (current) use of anticoagulants: Secondary | ICD-10-CM

## 2011-01-07 DIAGNOSIS — I4892 Unspecified atrial flutter: Secondary | ICD-10-CM

## 2011-01-07 LAB — POCT INR: INR: 1.5

## 2011-01-18 ENCOUNTER — Ambulatory Visit (INDEPENDENT_AMBULATORY_CARE_PROVIDER_SITE_OTHER): Payer: Medicare Other | Admitting: *Deleted

## 2011-01-18 DIAGNOSIS — I4892 Unspecified atrial flutter: Secondary | ICD-10-CM

## 2011-01-18 DIAGNOSIS — Z7901 Long term (current) use of anticoagulants: Secondary | ICD-10-CM

## 2011-01-18 LAB — POCT INR: INR: 2.1

## 2011-01-18 MED ORDER — WARFARIN SODIUM 3 MG PO TABS: 3.0000 mg | ORAL_TABLET | ORAL | Status: DC

## 2011-01-29 ENCOUNTER — Encounter (HOSPITAL_COMMUNITY): Payer: Self-pay

## 2011-01-29 ENCOUNTER — Emergency Department (HOSPITAL_COMMUNITY): Payer: Medicare Other

## 2011-01-29 ENCOUNTER — Emergency Department (HOSPITAL_COMMUNITY)
Admission: EM | Admit: 2011-01-29 | Discharge: 2011-01-29 | Disposition: A | Discharge status: Home / Self Care | Payer: Medicare Other | Attending: Emergency Medicine | Admitting: Emergency Medicine

## 2011-01-29 DIAGNOSIS — Z9849 Cataract extraction status, unspecified eye: Secondary | ICD-10-CM | POA: Insufficient documentation

## 2011-01-29 DIAGNOSIS — M169 Osteoarthritis of hip, unspecified: Secondary | ICD-10-CM

## 2011-01-29 DIAGNOSIS — K279 Peptic ulcer, site unspecified, unspecified as acute or chronic, without hemorrhage or perforation: Secondary | ICD-10-CM | POA: Insufficient documentation

## 2011-01-29 DIAGNOSIS — M159 Polyosteoarthritis, unspecified: Secondary | ICD-10-CM | POA: Insufficient documentation

## 2011-01-29 DIAGNOSIS — M25559 Pain in unspecified hip: Secondary | ICD-10-CM | POA: Insufficient documentation

## 2011-01-29 DIAGNOSIS — H3530 Unspecified macular degeneration: Secondary | ICD-10-CM | POA: Insufficient documentation

## 2011-01-29 DIAGNOSIS — I495 Sick sinus syndrome: Secondary | ICD-10-CM | POA: Insufficient documentation

## 2011-01-29 DIAGNOSIS — Z85038 Personal history of other malignant neoplasm of large intestine: Secondary | ICD-10-CM | POA: Insufficient documentation

## 2011-01-29 MED ORDER — ACETAMINOPHEN 325 MG PO TABS
650.0000 mg | ORAL_TABLET | Freq: Once | ORAL | Status: AC
  Administered 2011-01-29: 650 mg via ORAL
  Filled 2011-01-29: qty 2

## 2011-01-29 NOTE — ED Provider Notes (Signed)
History     CSN: [id number] Arrival date & time: 01/29/2011 11:45 AM   First MD Initiated Contact with Patient 01/29/11 1233      Chief Complaint  Patient presents with  . Hip Pain    (Consider location/radiation/quality/duration/timing/severity/associated sxs/prior treatment) HPI Comments: Pt was seen at 1:25pm. Pt is a 77 y/o F who presents with bilateral hip pain x 2 days. History si provided by the patient. The pain in the left hip is minimal but pain the right hip is described as severe. Acute onset, constant, worse since this morning. There was no fall or other injury. Pt has been told she has arthritis in the left hip and in her right knee in the past.  The pain is worse with weight-bearing. Pt typically walks with a cane but has had to use a walker since Wednesday. She has not taken any medications but has tried a heating pad with minimal symptom relief. There is no associated fever, redness, bruising, joint swelling, knee pain, numbness, tingling, or weakness.   Past Medical History  Diagnosis Date  . Macular degeneration   . Peptic ulcer disease     with history of upper GI bleeding in 2004  . Degenerative joint disease     hip and lumbosacral spine  . Adenocarcinoma of colon   . Sick sinus syndrome     Paroxysmal atrial fibrillation with a rapid ventricular response;   . Atrial fibrillation   . Bradycardia, severe sinus     Extreme; junctional bradycardia and sinus arrest up to 4 seconds    Past Surgical History  Procedure Date  . Cataract extraction   . Cholecystectomy   . Tonsillectomy   . Partial colectomy 2002    Partial colectomy for carcinoma of the colon-2002    Family History  Problem Relation Age of Onset  . Stroke Father     History  Substance Use Topics  . Smoking status: Never Smoker   . Smokeless tobacco: Never Used  . Alcohol Use: No    OB History    Grav Para Term Preterm Abortions TAB SAB Ect Mult Living                  Review of  Systems  Constitutional: Positive for activity change. Negative for fever and chills.  HENT: Negative for neck pain and neck stiffness.   Eyes: Negative for pain and redness.  Respiratory: Negative for cough, chest tightness and shortness of breath.   Cardiovascular: Negative for chest pain and leg swelling.  Gastrointestinal: Negative for abdominal pain, constipation and blood in stool.  Genitourinary:       Negative for dysuria, incontinence, retention.  Musculoskeletal: Positive for gait problem. Negative for back pain and joint swelling.       Positive bilateral hip pain. No pain to any extremities.  Skin: Negative for color change, rash and wound.  Neurological: Negative for dizziness, weakness and numbness.    Allergies  Review of patient's allergies indicates no known allergies.  Home Medications   Current Outpatient Rx  Name Route Sig Dispense Refill  . BROMDAY 0.09 % OP SOLN  1 drop as needed.     Marland Kitchen CALCIUM CARBONATE 600 MG PO TABS Oral Take 600 mg by mouth daily.      Marland Kitchen ESOMEPRAZOLE MAGNESIUM 40 MG PO CPDR Oral Take 40 mg by mouth daily before breakfast.      . FUROSEMIDE 20 MG PO TABS Oral Take 1 tablet (20 mg total)  by mouth daily. 30 tablet 11  . METOPROLOL SUCCINATE 50 MG PO TB24  TAKE ONE (1) TABLET EACH DAY 30 tablet 6  . MULTI-VITAMIN/MINERALS PO TABS Oral Take 1 tablet by mouth daily.      Marland Kitchen VISION VITAMINS PO Oral Take 1 tablet by mouth daily.      Marland Kitchen POTASSIUM CHLORIDE 10 MEQ PO TBCR Oral Take 1 tablet (10 mEq total) by mouth daily. 30 tablet 11  . SYNTHROID 125 MCG PO TABS Oral Take 125 mcg by mouth daily.     . TETRAHYDROZOLINE HCL 0.05 % OP SOLN  as directed.      . WARFARIN SODIUM 3 MG PO TABS Oral Take 1 tablet (3 mg total) by mouth as directed. 30 tablet 3    BP 150/60  Pulse 74  Resp 20  Ht 5\' 8"  (1.727 m)  Wt 127 lb (57.607 kg)  BMI 19.31 kg/m2  SpO2 100%  Physical Exam  Constitutional: She is oriented to person, place, and time. She appears  well-developed and well-nourished. No distress.  HENT:  Head: Normocephalic.       Bandage below left eye as result of surgical removal of skin lesion, bruising surrounding this area  Eyes: EOM are normal.  Neck: Normal range of motion. Neck supple.  Cardiovascular: Normal rate and regular rhythm.   Pulmonary/Chest: Effort normal and breath sounds normal. No respiratory distress.  Abdominal: Soft. She exhibits no distension. There is no tenderness.  Musculoskeletal: She exhibits no edema and no tenderness.       Right hip: She exhibits decreased range of motion and decreased strength. She exhibits no tenderness, no bony tenderness, no swelling, no crepitus and no deformity.       Left hip: She exhibits normal range of motion, normal strength, no tenderness, no bony tenderness, no swelling, no crepitus and no deformity.  Neurological: She is alert and oriented to person, place, and time. She has normal reflexes. No sensory deficit.  Skin: Skin is warm and dry. No bruising and no rash noted. No erythema.  Psychiatric: She has a normal mood and affect. Her behavior is normal.    ED Course  Procedures   MDM  77 y/o F with 2 days bilateral hip pain, much worse on R with decreased strength and ROM to the right hip. No known injury. Hx of OA to other joints. X-ray ordered. Pt refuses pain medication on initial examination at 1:25pm.  Dg Hip Complete Right  01/29/2011  *RADIOLOGY REPORT*  Clinical Data: Right hip pain for 2 days.  RIGHT HIP - COMPLETE 2+ VIEW  Comparison: Plain films right hip 11/27/2003.  Findings: Both hips are located.  No fracture.  There has been some progression in right hip osteoarthritis.  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  No fracture is identified.  The left hip is unremarkable.  IMPRESSION:  Progressive right hip osteoarthritis.  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  Original Report Authenticated By: Bernadene Bell. Maricela Curet, M.D.     Elwyn Reach [place], [place] 01/29/11 1427  Medical screening examination/treatment/procedure(s) were conducted as a shared visit with non-physician practitioner(s) and myself.  I personally evaluated the patient during the encounter   Laray Anger, DO 01/29/11 1505

## 2011-01-29 NOTE — ED Notes (Signed)
Pt presents with bilateral hip pain. Pt to triage via w/c. Pt denies fall or trauma. Pt states she has been using heating pad for pain. Pt states she has Hx of arthritis.

## 2011-01-29 NOTE — ED Provider Notes (Signed)
77yo F, c/o gradual onset and persistence of constant right hip pain x2 days, worse with hip ROM.  States she has hx "arthritis" in her right and left hips.  Has not taken any meds for pain.  Has been walking with her walker (vs her usual cane) for the pas 2 days d/t pain.  VSS, resps easy, +TTP right hip, pelvis stable, NT right knee, LS spine NT to palp.  XR with progressive right hip OA.  Pt and family requested I call Dr. Romeo Apple, her Ortho.  T/C to Dr. Romeo Apple, case discussed, including:  HPI, pertinent PM/SHx, VS/PE, dx testing, ED course and treatment.  Agreeable to see pt in ofc on Monday, symptomatic tx until then.  Dx testing, as well as d/w Ortho MD, d/w pt and family.  Questions answered.  Verb understanding.  Wants to go home now. Will d/c home with outpt f/u.   Medical screening examination/treatment/procedure(s) were conducted as a shared visit with non-physician practitioner(s) and myself.  I personally evaluated the patient during the encounter   Laray Anger, DO 01/29/11 1501

## 2011-02-01 ENCOUNTER — Ambulatory Visit (INDEPENDENT_AMBULATORY_CARE_PROVIDER_SITE_OTHER): Payer: Medicare Other | Admitting: Orthopedic Surgery

## 2011-02-01 ENCOUNTER — Telehealth: Payer: Self-pay | Admitting: Orthopedic Surgery

## 2011-02-01 ENCOUNTER — Encounter: Payer: Self-pay | Admitting: Orthopedic Surgery

## 2011-02-01 VITALS — BP 140/80 | Ht 68.0 in | Wt 127.0 lb

## 2011-02-01 DIAGNOSIS — M549 Dorsalgia, unspecified: Secondary | ICD-10-CM

## 2011-02-01 MED ORDER — DOCUSATE SODIUM 100 MG PO CAPS: 100.0000 mg | ORAL_CAPSULE | Freq: Two times a day (BID) | ORAL | Status: DC

## 2011-02-01 MED ORDER — ACETAMINOPHEN-CODEINE 300-30 MG PO TABS: 1.0000 | ORAL_TABLET | Freq: Four times a day (QID) | ORAL | Status: DC | PRN

## 2011-02-01 NOTE — Telephone Encounter (Signed)
Please advise re: schedule Wendy Garner, age 77 (DOB 1916/03/22) today, Monday 02/01/11. forz'   [place] [facility] fol/upgradual onset, constant RT hip pain,?  Xray at [facility] 01/29/11.  Daughter [place] [facility], cell ph # [phone] said to be seen today.

## 2011-02-01 NOTE — Patient Instructions (Addendum)
Back Pain, Adult Low back pain is very common. About 1 in 5 people have back pain.The cause of low back pain is rarely dangerous. The pain often gets better over time.About half of people with a sudden onset of back pain feel better in just 2 weeks. About 8 in 10 people feel better by 6 weeks.  CAUSES Some common causes of back pain include:  Strain of the muscles or ligaments supporting the spine.   Wear and tear (degeneration) of the spinal discs.   Arthritis.   Direct injury to the back.  DIAGNOSIS Most of the time, the direct cause of low back pain is not known.However, back pain can be treated effectively even when the exact cause of the pain is unknown.Answering your caregiver's questions about your overall health and symptoms is one of the most accurate ways to make sure the cause of your pain is not dangerous. If your caregiver needs more information, he or she may order lab work or imaging tests (X-rays or MRIs).However, even if imaging tests show changes in your back, this usually does not require surgery. HOME CARE INSTRUCTIONS For many people, back pain returns.Since low back pain is rarely dangerous, it is often a condition that people can learn to manageon their own.   Remain active. It is stressful on the back to sit or stand in one place. Do not sit, drive, or stand in one place for more than 30 minutes at a time. Take short walks on level surfaces as soon as pain allows.Try to increase the length of time you walk each day.   Do not stay in bed.Resting more than 1 or 2 days can delay your recovery.   Do not avoid exercise or work.Your body is made to move.It is not dangerous to be active, even though your back may hurt.Your back will likely heal faster if you return to being active before your pain is gone.   Pay attention to your body when you bend and lift. Many people have less discomfortwhen lifting if they bend their knees, keep the load close to their  bodies,and avoid twisting. Often, the most comfortable positions are those that put less stress on your recovering back.   Find a comfortable position to sleep. Use a firm mattress and lie on your side with your knees slightly bent. If you lie on your back, put a pillow under your knees.   Only take over-the-counter or prescription medicines as directed by your caregiver. Over-the-counter medicines to reduce pain and inflammation are often the most helpful.Your caregiver may prescribe muscle relaxant drugs.These medicines help dull your pain so you can more quickly return to your normal activities and healthy exercise.   Put ice on the injured area.   Put ice in a plastic bag.   Place a towel between your skin and the bag.   Leave the ice on for 15 to 20 minutes, 3 to 4 times a day for the first 2 to 3 days. After that, ice and heat may be alternated to reduce pain and spasms.   Ask your caregiver about trying back exercises and gentle massage. This may be of some benefit.   Avoid feeling anxious or stressed.Stress increases muscle tension and can worsen back pain.It is important to recognize when you are anxious or stressed and learn ways to manage it.Exercise is a great option.  SEEK MEDICAL CARE IF:  You have pain that is not relieved with rest or medicine.   You have   pain that does not improve in 1 week.   You have new symptoms.   You are generally not feeling well.  SEEK IMMEDIATE MEDICAL CARE IF:   You have pain that radiates from your back into your legs.   You develop new bowel or bladder control problems.   You have unusual weakness or numbness in your arms or legs.   You develop nausea or vomiting.   You develop abdominal pain.   You feel faint.  Document Released: 03/22/2005 Document Revised: 12/02/2010 Document Reviewed: 08/10/2010 [employer] Patient Information 2012 [employer]. 

## 2011-02-01 NOTE — Telephone Encounter (Signed)
Patient was scheduled and seen today, 02/01/11.

## 2011-02-02 ENCOUNTER — Encounter: Payer: Self-pay | Admitting: Orthopedic Surgery

## 2011-02-02 ENCOUNTER — Telehealth: Payer: Self-pay | Admitting: Orthopedic Surgery

## 2011-02-02 DIAGNOSIS — M549 Dorsalgia, unspecified: Secondary | ICD-10-CM | POA: Insufficient documentation

## 2011-02-02 NOTE — Telephone Encounter (Signed)
Take 1/2 tablet  instead

## 2011-02-02 NOTE — Telephone Encounter (Signed)
Wendy Garner called this morning to let you know that she took the Acetamophine-Codeine 300-30 mg about 10:30 last night, then at 12:30 woke up with a tightening in her chest and her heart rate seemed to increase for about 1 1/2 hours then she began to feel normal and was able to go back to sleep.  Has not taken any more of this pain medicine. Uses  Pharmacy if you change to anything else

## 2011-02-02 NOTE — Telephone Encounter (Signed)
Patient has been advised

## 2011-02-02 NOTE — Telephone Encounter (Signed)
Routed to the nurse to call the patient

## 2011-02-02 NOTE — Progress Notes (Signed)
Chief complaint RIGHT hip pain  77 year old female started having severe pain across the lower back and radiating into the RIGHT and LEFT hip over the last 4 days starting on Friday.  She is getting fairly for Tylenol after visit to the emergency room showed that she has osteoarthritis of both hips RIGHT greater than LEFT with mild protrusio of the RIGHT hip.  She has dull pain 7/10 across the small of her back which is constant.  It's worse when she is walking better when she is lying down she reports some catching in the lower back.  Review of systems positive for shortness of breath joint pain easy bruising all the other 11 systems were normal  Physical Exam(6) GENERAL: normal development   CDV: pulses are normal   Skin: normal  Psychiatric: awake, alert and oriented  Neuro: normal sensation  MSK She is ambulatory with a walker as she had been previously using a cane 1Range of motion in both hips is restricted RIGHT worse then LEFT the pain is not reproduced. 2 She is tender over the small of her back and over the midline of the back RIGHT worse than LEFT 3 There are no motor deficits4 Straight leg raises are negative  Assessment: Back pain    Plan: Recommend oral pain medication, on Coumadin therefore no anti-inflammatories  4 week f/u

## 2011-02-08 ENCOUNTER — Ambulatory Visit (INDEPENDENT_AMBULATORY_CARE_PROVIDER_SITE_OTHER): Payer: Medicare Other | Admitting: *Deleted

## 2011-02-08 DIAGNOSIS — I4892 Unspecified atrial flutter: Secondary | ICD-10-CM

## 2011-02-08 DIAGNOSIS — Z7901 Long term (current) use of anticoagulants: Secondary | ICD-10-CM

## 2011-02-08 MED ORDER — WARFARIN SODIUM 1 MG PO TABS: 1.0000 mg | ORAL_TABLET | ORAL | Status: DC

## 2011-03-01 ENCOUNTER — Ambulatory Visit (INDEPENDENT_AMBULATORY_CARE_PROVIDER_SITE_OTHER): Payer: Medicare Other | Admitting: *Deleted

## 2011-03-01 DIAGNOSIS — I4892 Unspecified atrial flutter: Secondary | ICD-10-CM

## 2011-03-01 DIAGNOSIS — Z7901 Long term (current) use of anticoagulants: Secondary | ICD-10-CM

## 2011-03-02 ENCOUNTER — Encounter: Payer: Self-pay | Admitting: Orthopedic Surgery

## 2011-03-02 ENCOUNTER — Ambulatory Visit (INDEPENDENT_AMBULATORY_CARE_PROVIDER_SITE_OTHER): Payer: Medicare Other | Admitting: Orthopedic Surgery

## 2011-03-02 DIAGNOSIS — M1610 Unilateral primary osteoarthritis, unspecified hip: Secondary | ICD-10-CM

## 2011-03-02 DIAGNOSIS — M549 Dorsalgia, unspecified: Secondary | ICD-10-CM

## 2011-03-02 NOTE — Progress Notes (Signed)
77 year old female started having severe pain across the lower back and radiating into the RIGHT and LEFT hip over the last 4 days starting on Friday. She is getting fairly for Tylenol after visit to the emergency room showed that she has osteoarthritis of both hips RIGHT greater than LEFT with mild protrusio of the RIGHT hip. She has dull pain 7/10 across the small of her back which is constant. It's worse when she is walking better when she is lying down she reports some catching in the lower back. Review of systems positive for shortness of breath joint pain easy bruising all the other 11 systems were normal   This is a followup visit.  She has made significant improvement is taking oral pain medication. She is having no symptoms at this time. She is ambulatory with a cane.  We are planning to continue with medication as needed. She is taking half tablet in the morning and at night on a very minimal basis.  Diagnosis back pain, improved

## 2011-03-02 NOTE — Patient Instructions (Signed)
meds as needed °

## 2011-03-15 ENCOUNTER — Ambulatory Visit (INDEPENDENT_AMBULATORY_CARE_PROVIDER_SITE_OTHER): Payer: Medicare Other | Admitting: *Deleted

## 2011-03-15 DIAGNOSIS — Z7901 Long term (current) use of anticoagulants: Secondary | ICD-10-CM

## 2011-03-15 DIAGNOSIS — I4892 Unspecified atrial flutter: Secondary | ICD-10-CM

## 2011-03-24 ENCOUNTER — Other Ambulatory Visit: Payer: Self-pay

## 2011-03-24 MED ORDER — METOPROLOL SUCCINATE ER 50 MG PO TB24: 50.0000 mg | ORAL_TABLET | Freq: Every day | ORAL | Status: DC

## 2011-04-05 ENCOUNTER — Ambulatory Visit (INDEPENDENT_AMBULATORY_CARE_PROVIDER_SITE_OTHER): Payer: Medicare Other | Admitting: *Deleted

## 2011-04-05 DIAGNOSIS — Z7901 Long term (current) use of anticoagulants: Secondary | ICD-10-CM

## 2011-04-05 DIAGNOSIS — I4892 Unspecified atrial flutter: Secondary | ICD-10-CM

## 2011-04-05 LAB — POCT INR: INR: 1.8

## 2011-04-26 ENCOUNTER — Ambulatory Visit (INDEPENDENT_AMBULATORY_CARE_PROVIDER_SITE_OTHER): Payer: Medicare Other | Admitting: *Deleted

## 2011-04-26 DIAGNOSIS — I4892 Unspecified atrial flutter: Secondary | ICD-10-CM

## 2011-04-26 DIAGNOSIS — Z7901 Long term (current) use of anticoagulants: Secondary | ICD-10-CM

## 2011-04-26 LAB — POCT INR: INR: 2.1

## 2011-05-24 ENCOUNTER — Ambulatory Visit (INDEPENDENT_AMBULATORY_CARE_PROVIDER_SITE_OTHER): Payer: Medicare Other | Admitting: *Deleted

## 2011-05-24 DIAGNOSIS — I4892 Unspecified atrial flutter: Secondary | ICD-10-CM

## 2011-05-24 DIAGNOSIS — Z7901 Long term (current) use of anticoagulants: Secondary | ICD-10-CM

## 2011-05-24 LAB — POCT INR: INR: 1.9

## 2011-05-24 MED ORDER — WARFARIN SODIUM 1 MG PO TABS: 1.0000 mg | ORAL_TABLET | ORAL | Status: DC

## 2011-06-18 ENCOUNTER — Ambulatory Visit (INDEPENDENT_AMBULATORY_CARE_PROVIDER_SITE_OTHER): Payer: Medicare Other | Admitting: *Deleted

## 2011-06-18 ENCOUNTER — Encounter: Payer: Self-pay | Admitting: Internal Medicine

## 2011-06-18 DIAGNOSIS — I495 Sick sinus syndrome: Secondary | ICD-10-CM

## 2011-06-18 DIAGNOSIS — I4892 Unspecified atrial flutter: Secondary | ICD-10-CM

## 2011-06-18 LAB — PACEMAKER DEVICE OBSERVATION
BMOD-0005RV: 95 {beats}/min
BRDY-0002RV: 70 {beats}/min
BRDY-0004RV: 130 {beats}/min

## 2011-06-18 NOTE — Progress Notes (Signed)
PPM check 

## 2011-06-21 ENCOUNTER — Ambulatory Visit (INDEPENDENT_AMBULATORY_CARE_PROVIDER_SITE_OTHER): Payer: Medicare Other | Admitting: *Deleted

## 2011-06-21 DIAGNOSIS — Z7901 Long term (current) use of anticoagulants: Secondary | ICD-10-CM

## 2011-06-21 DIAGNOSIS — I4892 Unspecified atrial flutter: Secondary | ICD-10-CM

## 2011-07-05 ENCOUNTER — Encounter: Payer: Self-pay | Admitting: Internal Medicine

## 2011-07-12 ENCOUNTER — Ambulatory Visit (INDEPENDENT_AMBULATORY_CARE_PROVIDER_SITE_OTHER): Payer: Medicare Other | Admitting: *Deleted

## 2011-07-12 DIAGNOSIS — Z7901 Long term (current) use of anticoagulants: Secondary | ICD-10-CM

## 2011-07-12 DIAGNOSIS — I4892 Unspecified atrial flutter: Secondary | ICD-10-CM

## 2011-08-09 ENCOUNTER — Ambulatory Visit (INDEPENDENT_AMBULATORY_CARE_PROVIDER_SITE_OTHER): Payer: Medicare Other | Admitting: *Deleted

## 2011-08-09 DIAGNOSIS — I4892 Unspecified atrial flutter: Secondary | ICD-10-CM

## 2011-08-09 DIAGNOSIS — Z7901 Long term (current) use of anticoagulants: Secondary | ICD-10-CM

## 2011-08-09 LAB — POCT INR: INR: 2.2

## 2011-09-06 ENCOUNTER — Ambulatory Visit (INDEPENDENT_AMBULATORY_CARE_PROVIDER_SITE_OTHER): Payer: Medicare Other | Admitting: *Deleted

## 2011-09-06 DIAGNOSIS — I4892 Unspecified atrial flutter: Secondary | ICD-10-CM

## 2011-09-06 DIAGNOSIS — Z7901 Long term (current) use of anticoagulants: Secondary | ICD-10-CM

## 2011-09-06 LAB — POCT INR: INR: 2.2

## 2011-09-06 MED ORDER — WARFARIN SODIUM 1 MG PO TABS: 1.0000 mg | ORAL_TABLET | ORAL | Status: DC

## 2011-09-21 ENCOUNTER — Other Ambulatory Visit: Payer: Self-pay | Admitting: *Deleted

## 2011-09-21 DIAGNOSIS — I5032 Chronic diastolic (congestive) heart failure: Secondary | ICD-10-CM

## 2011-09-21 MED ORDER — FUROSEMIDE 20 MG PO TABS: 20.0000 mg | ORAL_TABLET | Freq: Every day | ORAL | Status: DC

## 2011-09-21 MED ORDER — POTASSIUM CHLORIDE ER 10 MEQ PO TBCR: 10.0000 meq | EXTENDED_RELEASE_TABLET | Freq: Every day | ORAL | Status: DC

## 2011-10-11 ENCOUNTER — Ambulatory Visit (INDEPENDENT_AMBULATORY_CARE_PROVIDER_SITE_OTHER): Payer: Medicare Other | Admitting: *Deleted

## 2011-10-11 DIAGNOSIS — Z7901 Long term (current) use of anticoagulants: Secondary | ICD-10-CM

## 2011-10-11 DIAGNOSIS — I4892 Unspecified atrial flutter: Secondary | ICD-10-CM

## 2011-10-22 ENCOUNTER — Other Ambulatory Visit: Payer: Self-pay | Admitting: *Deleted

## 2011-10-22 ENCOUNTER — Other Ambulatory Visit: Payer: Self-pay | Admitting: Internal Medicine

## 2011-10-22 MED ORDER — METOPROLOL SUCCINATE ER 50 MG PO TB24: 50.0000 mg | ORAL_TABLET | Freq: Every day | ORAL | Status: DC

## 2011-11-22 ENCOUNTER — Ambulatory Visit (INDEPENDENT_AMBULATORY_CARE_PROVIDER_SITE_OTHER): Payer: Medicare Other | Admitting: *Deleted

## 2011-11-22 DIAGNOSIS — Z7901 Long term (current) use of anticoagulants: Secondary | ICD-10-CM

## 2011-11-22 DIAGNOSIS — I4892 Unspecified atrial flutter: Secondary | ICD-10-CM

## 2011-11-22 LAB — POCT INR: INR: 1.8

## 2011-12-02 ENCOUNTER — Encounter: Payer: Self-pay | Admitting: Internal Medicine

## 2011-12-02 ENCOUNTER — Ambulatory Visit (INDEPENDENT_AMBULATORY_CARE_PROVIDER_SITE_OTHER): Payer: Medicare Other | Admitting: Internal Medicine

## 2011-12-02 VITALS — BP 122/78 | HR 90 | Ht 68.0 in | Wt 125.8 lb

## 2011-12-02 DIAGNOSIS — I5032 Chronic diastolic (congestive) heart failure: Secondary | ICD-10-CM

## 2011-12-02 DIAGNOSIS — I495 Sick sinus syndrome: Secondary | ICD-10-CM

## 2011-12-02 DIAGNOSIS — Z950 Presence of cardiac pacemaker: Secondary | ICD-10-CM

## 2011-12-02 LAB — PACEMAKER DEVICE OBSERVATION
RV LEAD THRESHOLD: 0.5 V
VENTRICULAR PACING PM: 100

## 2011-12-02 MED ORDER — METOPROLOL SUCCINATE ER 25 MG PO TB24: 25.0000 mg | ORAL_TABLET | Freq: Every day | ORAL | Status: DC

## 2011-12-02 NOTE — Progress Notes (Signed)
HPI Wendy Garner returns today for followup. She is a very pleasant 78 year old woman with a history of symptomatic bradycardia, status post permanent pacemaker insertion. She also has paroxysmal atrial fibrillation and is anticoagulated with warfarin. Despite her advanced age, she continues to do well. She has minimal dyspnea with exertion. Her appetite is good. She denies falls or loss of consciousness. No chest pain. She does note some mild fatigue on her beta blocker and wonders if she could reduce the dose. No Known Allergies   Current Outpatient Prescriptions  Medication Sig Dispense Refill  . calcium carbonate (OS-CAL) 600 MG TABS Take 600 mg by mouth daily.       . furosemide (LASIX) 20 MG tablet Take 1 tablet (20 mg total) by mouth daily.  30 tablet  11  . levothyroxine (SYNTHROID, LEVOTHROID) 88 MCG tablet Take 88 mcg by mouth daily.      . metoprolol succinate (TOPROL-XL) 25 MG 24 hr tablet Take 1 tablet (25 mg total) by mouth daily.  30 tablet  6  . Multiple Vitamins-Minerals (CENTRUM SILVER ULTRA WOMENS PO) Take 1 tablet by mouth daily.        . multivitamin-lutein (OCUVITE-LUTEIN) CAPS Take 1 capsule by mouth daily.        Marland Kitchen omeprazole (PRILOSEC) 20 MG capsule Take 20 mg by mouth daily.      . potassium chloride (K-DUR) 10 MEQ tablet Take 1 tablet (10 mEq total) by mouth daily.  30 tablet  6  . warfarin (COUMADIN) 1 MG tablet Take 1 tablet (1 mg total) by mouth as directed. Take 1 tablet daily except 2 tablets on M,W,F  50 tablet  3  . DISCONTD: metoprolol succinate (TOPROL-XL) 50 MG 24 hr tablet Take 1 tablet (50 mg total) by mouth daily.  30 tablet  6     Past Medical History  Diagnosis Date  . Macular degeneration   . Peptic ulcer disease     with history of upper GI bleeding in 2004  . Degenerative joint disease     hip and lumbosacral spine  . Adenocarcinoma of colon   . Sick sinus syndrome     Paroxysmal atrial fibrillation with a rapid ventricular response;   .  Atrial fibrillation   . Bradycardia, severe sinus     Extreme; junctional bradycardia and sinus arrest up to 4 seconds    ROS:   All systems reviewed and negative except as noted in the HPI.   Past Surgical History  Procedure Date  . Cataract extraction   . Cholecystectomy   . Tonsillectomy   . Partial colectomy 2002    Partial colectomy for carcinoma of the colon-2002     Family History  Problem Relation Age of Onset  . Stroke Father      History   Social History  . Marital Status: Widowed    Spouse Name: N/A    Number of Children: 2  . Years of Education: N/A   Occupational History  . RETIRED     [employer]   Social History Main Topics  . Smoking status: Never Smoker   . Smokeless tobacco: Never Used  . Alcohol Use: No  . Drug Use: No  . Sexually Active: Not on file   Other Topics Concern  . Not on file   Social History Narrative   Resides in [place].     BP 122/78  Pulse 90  Ht 5\' 8"  (1.727 m)  Wt 125 lb 12.8 oz (57.063 kg)  BMI 19.13 kg/m2  SpO2 97%  Physical Exam:  Well appearing elderly woman, NAD HEENT: Unremarkable Neck:  No JVD, no thyromegally Lungs:  Clear with no wheezes, rales, or rhonchi. Well-healed pacemaker incision. HEART:  Regular rate rhythm, no murmurs, no rubs, no clicks Abd:  soft, positive bowel sounds, no organomegally, no rebound, no guarding Ext:  2 plus pulses, no edema, no cyanosis, no clubbing Skin:  No rashes no nodules Neuro:  CN II through XII intact, motor grossly intact  DEVICE  Normal device function.  See PaceArt for details.   Assess/Plan:

## 2011-12-02 NOTE — Assessment & Plan Note (Signed)
Her device is working normally. We'll plan to recheck her Medtronic dual-chamber pacemaker in several months.

## 2011-12-02 NOTE — Assessment & Plan Note (Signed)
Her symptoms are well-controlled. I've asked her to reduce her salt intake. I recommended that she reduce her dose of Toprol to 25 mg daily.

## 2011-12-02 NOTE — Patient Instructions (Addendum)
Your physician wants you to follow-up in: 12 months with Dr. Taylor. You will receive a reminder letter in the mail two months in advance. If you don't receive a letter, please call our office to schedule the follow-up appointment.    

## 2011-12-03 ENCOUNTER — Encounter: Payer: Self-pay | Admitting: Internal Medicine

## 2011-12-13 ENCOUNTER — Ambulatory Visit (INDEPENDENT_AMBULATORY_CARE_PROVIDER_SITE_OTHER): Payer: Medicare Other | Admitting: *Deleted

## 2011-12-13 DIAGNOSIS — I4892 Unspecified atrial flutter: Secondary | ICD-10-CM

## 2011-12-13 DIAGNOSIS — Z7901 Long term (current) use of anticoagulants: Secondary | ICD-10-CM

## 2011-12-13 MED ORDER — WARFARIN SODIUM 1 MG PO TABS: ORAL_TABLET | ORAL | Status: DC

## 2012-01-10 ENCOUNTER — Ambulatory Visit (INDEPENDENT_AMBULATORY_CARE_PROVIDER_SITE_OTHER): Payer: Medicare Other | Admitting: *Deleted

## 2012-01-10 DIAGNOSIS — I4892 Unspecified atrial flutter: Secondary | ICD-10-CM

## 2012-01-10 DIAGNOSIS — Z7901 Long term (current) use of anticoagulants: Secondary | ICD-10-CM

## 2012-01-10 LAB — POCT INR: INR: 2.9

## 2012-02-07 ENCOUNTER — Ambulatory Visit (INDEPENDENT_AMBULATORY_CARE_PROVIDER_SITE_OTHER): Payer: Medicare Other | Admitting: *Deleted

## 2012-02-07 DIAGNOSIS — I4892 Unspecified atrial flutter: Secondary | ICD-10-CM

## 2012-02-07 DIAGNOSIS — Z7901 Long term (current) use of anticoagulants: Secondary | ICD-10-CM

## 2012-02-07 LAB — POCT INR: INR: 3.5

## 2012-02-28 ENCOUNTER — Ambulatory Visit (INDEPENDENT_AMBULATORY_CARE_PROVIDER_SITE_OTHER): Payer: Medicare Other | Admitting: *Deleted

## 2012-02-28 DIAGNOSIS — Z7901 Long term (current) use of anticoagulants: Secondary | ICD-10-CM

## 2012-02-28 DIAGNOSIS — I4892 Unspecified atrial flutter: Secondary | ICD-10-CM

## 2012-03-27 ENCOUNTER — Ambulatory Visit (INDEPENDENT_AMBULATORY_CARE_PROVIDER_SITE_OTHER): Payer: Medicare Other | Admitting: *Deleted

## 2012-03-27 DIAGNOSIS — I4892 Unspecified atrial flutter: Secondary | ICD-10-CM

## 2012-03-27 DIAGNOSIS — Z7901 Long term (current) use of anticoagulants: Secondary | ICD-10-CM

## 2012-04-11 ENCOUNTER — Other Ambulatory Visit: Payer: Self-pay | Admitting: Cardiology

## 2012-04-13 ENCOUNTER — Telehealth: Payer: Self-pay | Admitting: *Deleted

## 2012-04-13 MED ORDER — POTASSIUM CHLORIDE ER 10 MEQ PO TBCR: 10.0000 meq | EXTENDED_RELEASE_TABLET | Freq: Every day | ORAL | Status: DC

## 2012-04-13 NOTE — Telephone Encounter (Signed)
Received incoming call from [place] with [facility] to advise pt needs refill on potassium, sent refills for 6 months via escribe per declined verbal order at that time

## 2012-04-18 IMAGING — CR DG CHEST 2V
2 series · 2 of 2 positions shown · non-contrast
Comparison: 05/13/2009

CLINICAL DATA: Shortness of breath.  Tachycardia.

CHEST - 2 VIEW

[view not recorded (1 of 2)]
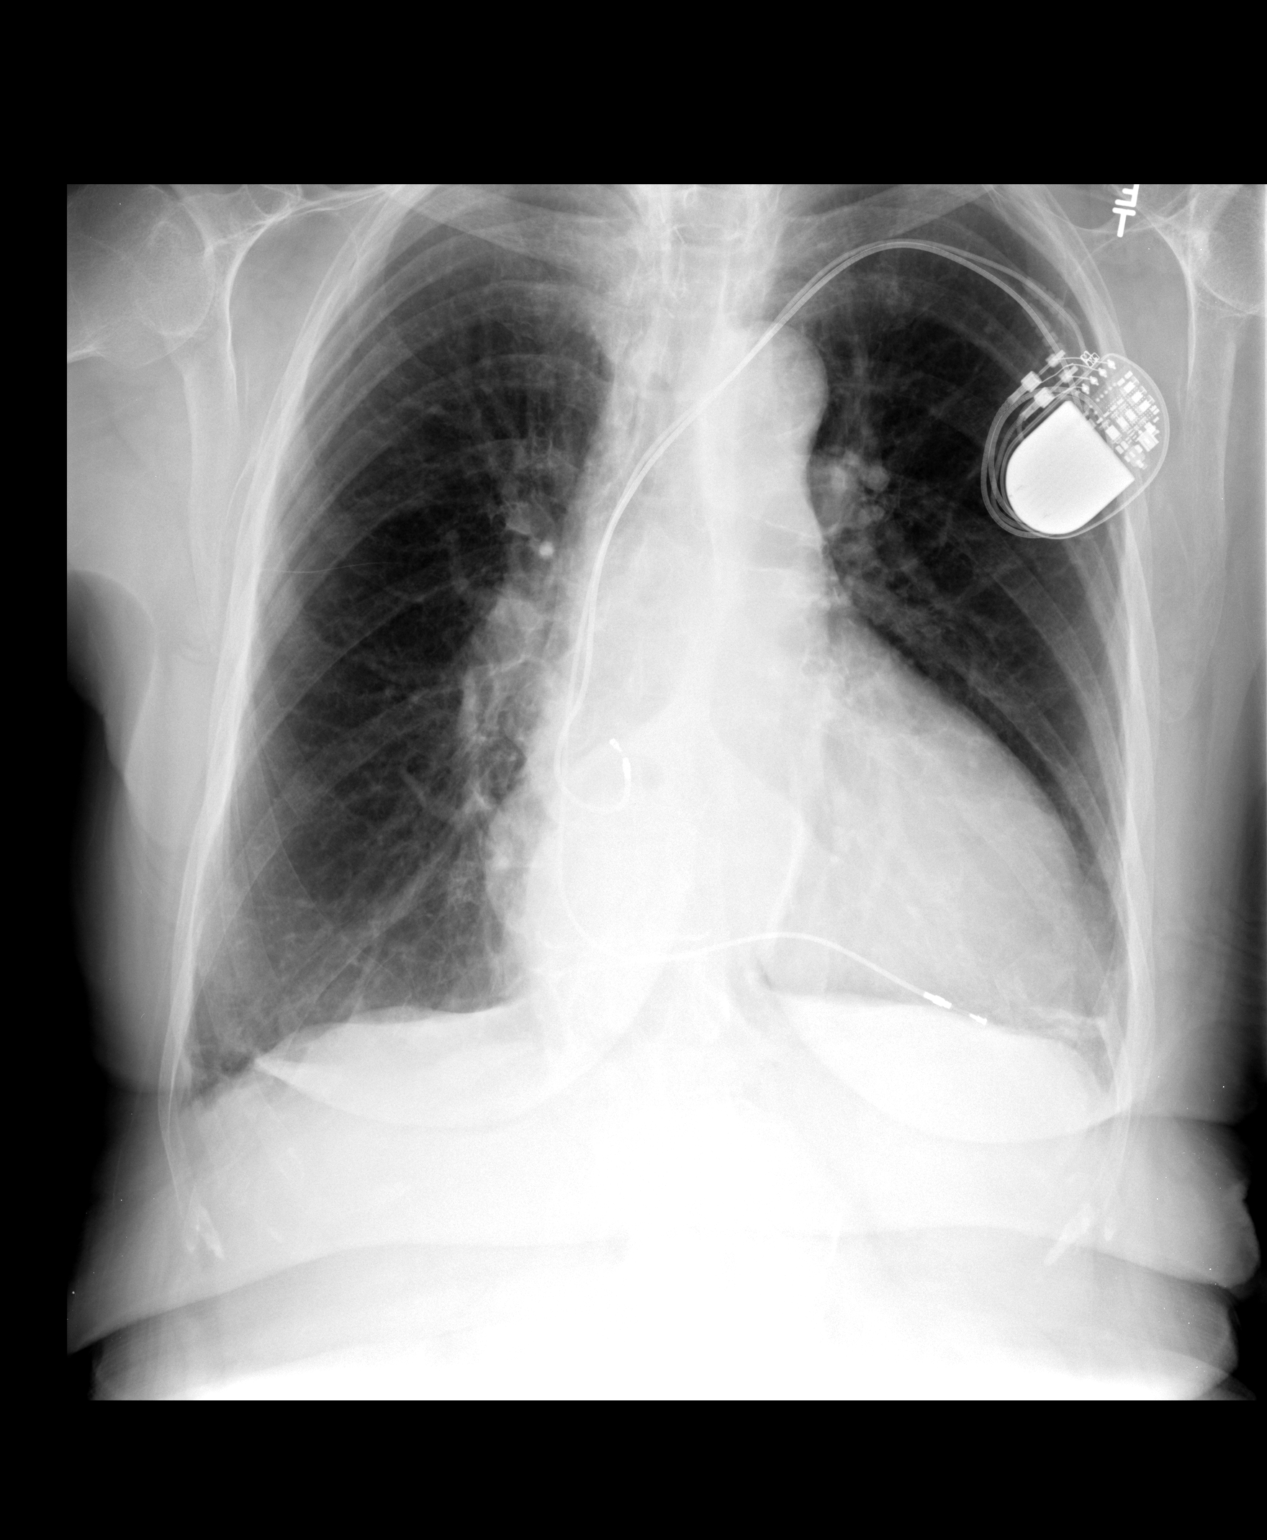

[view not recorded (2 of 2)]
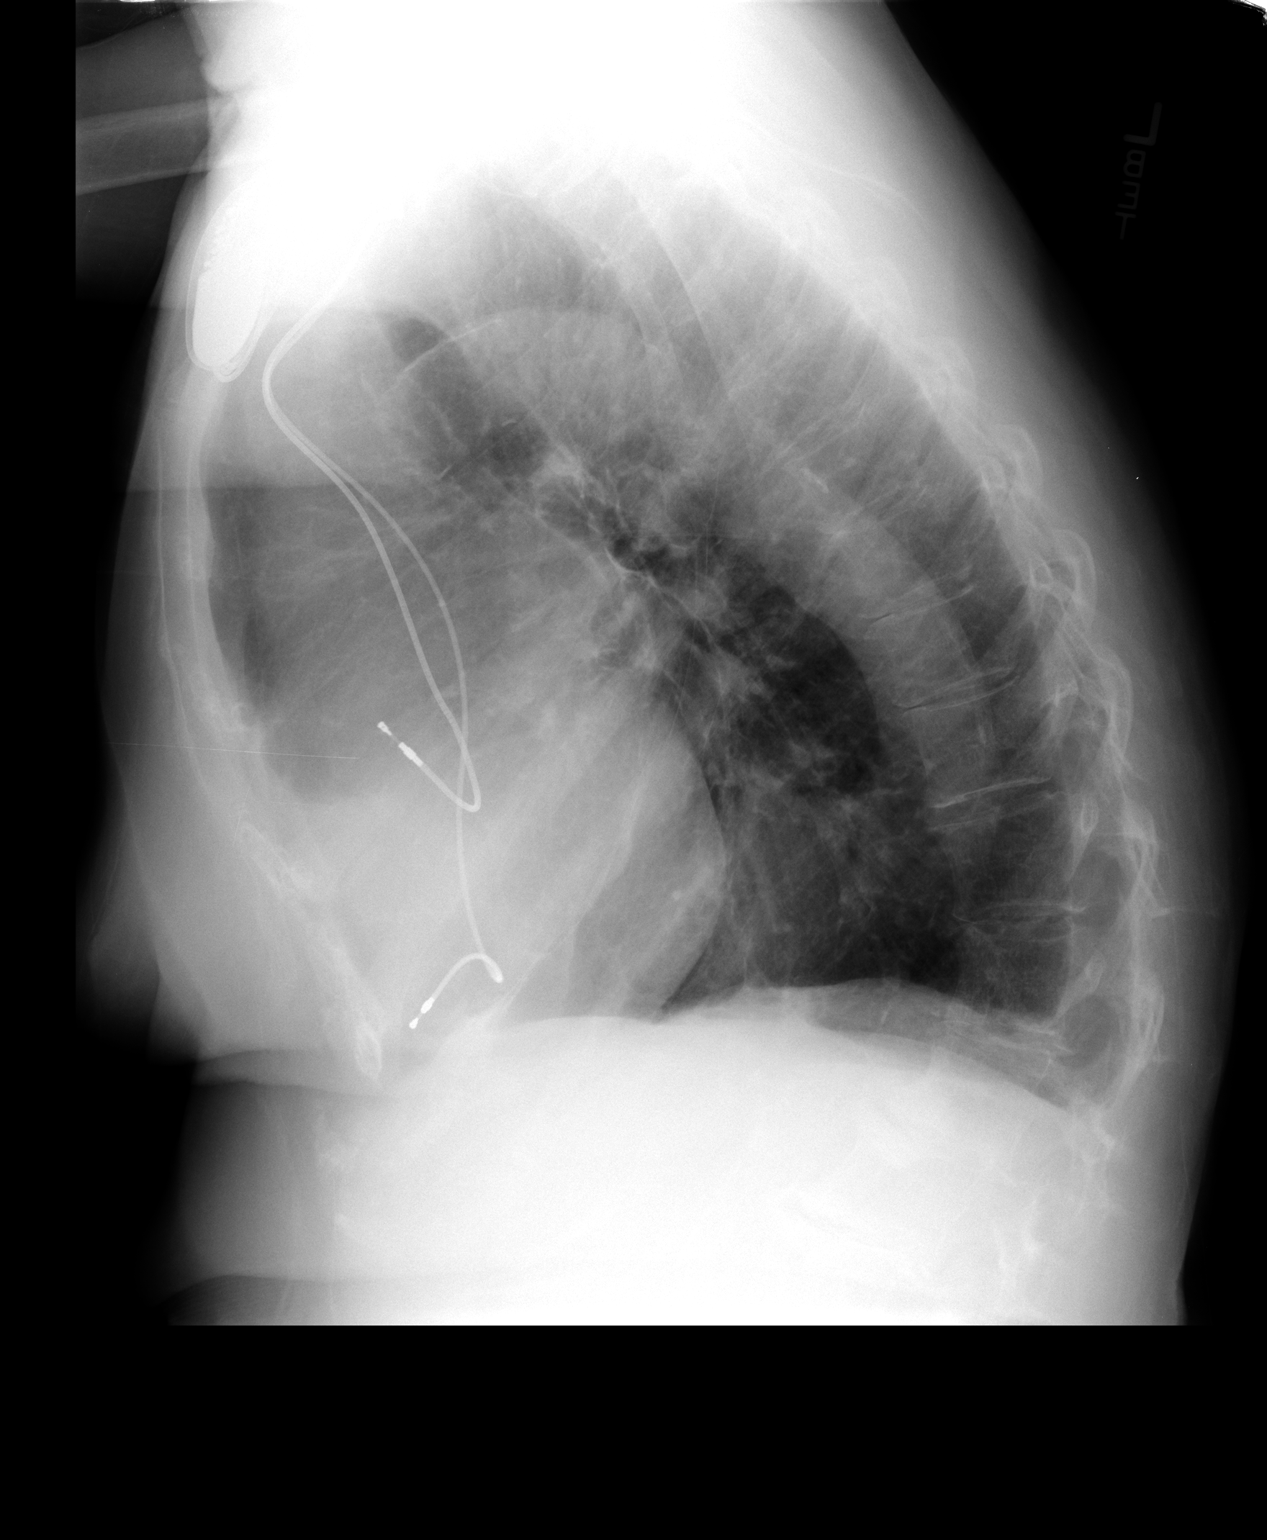

[2 of 2 positions shown; findings below may reference images not displayed]

FINDINGS: Moderate cardiomegaly stable.  Dual lead transvenous
pacemaker remains in appropriate position.  Pulmonary
hyperinflation is seen, consistent with COPD.  No evidence of
pulmonary edema or infiltrate.  Mild scarring seen in the lateral
left lung base.  No evidence of pleural effusion.  No mass or
lymphadenopathy identified.  Moderate size hiatal hernia again
noted.
IMPRESSION: 1. COPD.  No active lung disease.
2.  Stable cardiomegaly and moderate hiatal hernia.

## 2012-04-24 ENCOUNTER — Ambulatory Visit (INDEPENDENT_AMBULATORY_CARE_PROVIDER_SITE_OTHER): Payer: Medicare Other | Admitting: *Deleted

## 2012-04-24 DIAGNOSIS — I4892 Unspecified atrial flutter: Secondary | ICD-10-CM

## 2012-04-24 DIAGNOSIS — Z7901 Long term (current) use of anticoagulants: Secondary | ICD-10-CM

## 2012-05-22 ENCOUNTER — Ambulatory Visit (INDEPENDENT_AMBULATORY_CARE_PROVIDER_SITE_OTHER): Payer: Medicare Other | Admitting: *Deleted

## 2012-05-22 DIAGNOSIS — Z7901 Long term (current) use of anticoagulants: Secondary | ICD-10-CM

## 2012-05-22 DIAGNOSIS — I4892 Unspecified atrial flutter: Secondary | ICD-10-CM

## 2012-05-22 LAB — POCT INR: INR: 2.4

## 2012-06-20 ENCOUNTER — Other Ambulatory Visit: Payer: Self-pay | Admitting: Internal Medicine

## 2012-06-20 ENCOUNTER — Ambulatory Visit (INDEPENDENT_AMBULATORY_CARE_PROVIDER_SITE_OTHER): Payer: Medicare Other | Admitting: *Deleted

## 2012-06-20 ENCOUNTER — Encounter: Payer: Self-pay | Admitting: Internal Medicine

## 2012-06-20 DIAGNOSIS — I4892 Unspecified atrial flutter: Secondary | ICD-10-CM

## 2012-06-20 DIAGNOSIS — I5032 Chronic diastolic (congestive) heart failure: Secondary | ICD-10-CM

## 2012-06-20 LAB — PACEMAKER DEVICE OBSERVATION
BMOD-0003RV: 30
BRDY-0004RV: 130 {beats}/min

## 2012-06-20 NOTE — Progress Notes (Signed)
PPM check 

## 2012-06-21 ENCOUNTER — Telehealth: Payer: Self-pay | Admitting: *Deleted

## 2012-06-21 NOTE — Telephone Encounter (Signed)
Patient states that she has had a tooth to break that she is having to get pulled.  Wants to know what she should do about her coumadin prior to getting tooth pulled. / tgs

## 2012-06-21 NOTE — Telephone Encounter (Signed)
Spoke with pt.  OK to hold coumadin 2 days prior to dental extraction and resume coumadin night of procedure.  Pt verbalized understanding.

## 2012-07-03 ENCOUNTER — Ambulatory Visit (INDEPENDENT_AMBULATORY_CARE_PROVIDER_SITE_OTHER): Payer: Medicare Other | Admitting: *Deleted

## 2012-07-03 DIAGNOSIS — Z7901 Long term (current) use of anticoagulants: Secondary | ICD-10-CM

## 2012-07-03 DIAGNOSIS — I4892 Unspecified atrial flutter: Secondary | ICD-10-CM

## 2012-07-03 MED ORDER — WARFARIN SODIUM 1 MG PO TABS: 1.0000 mg | ORAL_TABLET | ORAL | Status: DC

## 2012-07-26 ENCOUNTER — Ambulatory Visit (INDEPENDENT_AMBULATORY_CARE_PROVIDER_SITE_OTHER): Payer: Medicare Other | Admitting: *Deleted

## 2012-07-26 DIAGNOSIS — I4892 Unspecified atrial flutter: Secondary | ICD-10-CM

## 2012-07-26 DIAGNOSIS — Z7901 Long term (current) use of anticoagulants: Secondary | ICD-10-CM

## 2012-07-26 LAB — POCT INR: INR: 2.2

## 2012-08-21 ENCOUNTER — Ambulatory Visit (INDEPENDENT_AMBULATORY_CARE_PROVIDER_SITE_OTHER): Payer: Medicare Other | Admitting: *Deleted

## 2012-08-21 DIAGNOSIS — I495 Sick sinus syndrome: Secondary | ICD-10-CM

## 2012-08-21 DIAGNOSIS — I4892 Unspecified atrial flutter: Secondary | ICD-10-CM

## 2012-08-21 DIAGNOSIS — Z7901 Long term (current) use of anticoagulants: Secondary | ICD-10-CM

## 2012-08-21 MED ORDER — METOPROLOL SUCCINATE ER 25 MG PO TB24: 25.0000 mg | ORAL_TABLET | Freq: Every day | ORAL | Status: DC

## 2012-09-11 ENCOUNTER — Ambulatory Visit (INDEPENDENT_AMBULATORY_CARE_PROVIDER_SITE_OTHER): Payer: Medicare Other | Admitting: *Deleted

## 2012-09-11 DIAGNOSIS — Z7901 Long term (current) use of anticoagulants: Secondary | ICD-10-CM

## 2012-09-11 DIAGNOSIS — I4892 Unspecified atrial flutter: Secondary | ICD-10-CM

## 2012-10-09 ENCOUNTER — Ambulatory Visit (INDEPENDENT_AMBULATORY_CARE_PROVIDER_SITE_OTHER): Payer: Medicare Other | Admitting: *Deleted

## 2012-10-09 DIAGNOSIS — I5032 Chronic diastolic (congestive) heart failure: Secondary | ICD-10-CM

## 2012-10-09 DIAGNOSIS — I4892 Unspecified atrial flutter: Secondary | ICD-10-CM

## 2012-10-09 DIAGNOSIS — Z7901 Long term (current) use of anticoagulants: Secondary | ICD-10-CM

## 2012-10-09 LAB — POCT INR: INR: 2.9

## 2012-10-09 MED ORDER — POTASSIUM CHLORIDE ER 10 MEQ PO TBCR: 10.0000 meq | EXTENDED_RELEASE_TABLET | Freq: Every day | ORAL | Status: DC

## 2012-10-09 MED ORDER — FUROSEMIDE 20 MG PO TABS: 20.0000 mg | ORAL_TABLET | Freq: Every day | ORAL | Status: DC

## 2012-11-06 ENCOUNTER — Ambulatory Visit (INDEPENDENT_AMBULATORY_CARE_PROVIDER_SITE_OTHER): Payer: Medicare Other | Admitting: *Deleted

## 2012-11-06 DIAGNOSIS — I4892 Unspecified atrial flutter: Secondary | ICD-10-CM

## 2012-11-06 DIAGNOSIS — Z7901 Long term (current) use of anticoagulants: Secondary | ICD-10-CM

## 2012-11-06 LAB — POCT INR: INR: 2

## 2012-11-06 MED ORDER — WARFARIN SODIUM 1 MG PO TABS: ORAL_TABLET | ORAL | Status: DC

## 2012-12-13 ENCOUNTER — Encounter: Payer: Self-pay | Admitting: Internal Medicine

## 2012-12-13 ENCOUNTER — Ambulatory Visit (INDEPENDENT_AMBULATORY_CARE_PROVIDER_SITE_OTHER): Payer: Medicare Other | Admitting: *Deleted

## 2012-12-13 ENCOUNTER — Ambulatory Visit (INDEPENDENT_AMBULATORY_CARE_PROVIDER_SITE_OTHER): Payer: Medicare Other | Admitting: Internal Medicine

## 2012-12-13 VITALS — BP 122/78 | HR 78 | Ht 67.5 in | Wt 130.0 lb

## 2012-12-13 DIAGNOSIS — I4892 Unspecified atrial flutter: Secondary | ICD-10-CM

## 2012-12-13 DIAGNOSIS — I5032 Chronic diastolic (congestive) heart failure: Secondary | ICD-10-CM

## 2012-12-13 DIAGNOSIS — Z7901 Long term (current) use of anticoagulants: Secondary | ICD-10-CM

## 2012-12-13 DIAGNOSIS — Z950 Presence of cardiac pacemaker: Secondary | ICD-10-CM

## 2012-12-13 LAB — PACEMAKER DEVICE OBSERVATION
BATTERY VOLTAGE: 2.79 V
BRDY-0002RV: 70 {beats}/min
BRDY-0004RV: 130 {beats}/min

## 2012-12-13 LAB — POCT INR: INR: 2.2

## 2012-12-13 NOTE — Progress Notes (Signed)
HPI Wendy Garner returns today for followup. She is a very pleasant 79 year old woman with a history of chronic atrial arrhythmias, status post AV node ablation and pacemaker insertion, chronic anticoagulation, and hypertension. She has diastolic heart failure which has been well-controlled. She has minimal peripheral edema, and denies syncope or chest pain. Her heart failure symptoms are class II. No Known Allergies   Current Outpatient Prescriptions  Medication Sig Dispense Refill  . calcium carbonate (OS-CAL) 600 MG TABS Take 600 mg by mouth daily.       . furosemide (LASIX) 20 MG tablet Take 1 tablet (20 mg total) by mouth daily.  30 tablet  6  . levothyroxine (SYNTHROID, LEVOTHROID) 88 MCG tablet Take 88 mcg by mouth daily.      . metoprolol succinate (TOPROL-XL) 25 MG 24 hr tablet Take 1 tablet (25 mg total) by mouth daily.  30 tablet  6  . Multiple Vitamins-Minerals (CENTRUM SILVER ULTRA WOMENS PO) Take 1 tablet by mouth daily.        . multivitamin-lutein (OCUVITE-LUTEIN) CAPS Take 1 capsule by mouth daily.        Marland Kitchen omeprazole (PRILOSEC) 20 MG capsule Take 20 mg by mouth daily.      . potassium chloride (K-DUR) 10 MEQ tablet Take 1 tablet (10 mEq total) by mouth daily.  30 tablet  6  . warfarin (COUMADIN) 1 MG tablet Take 2 tablets daily except 1 tablet on Friday  60 tablet  6   No current facility-administered medications for this visit.     Past Medical History  Diagnosis Date  . Macular degeneration   . Peptic ulcer disease     with history of upper GI bleeding in 2004  . Degenerative joint disease     hip and lumbosacral spine  . Adenocarcinoma of colon   . Sick sinus syndrome     Paroxysmal atrial fibrillation with a rapid ventricular response;   . Atrial fibrillation   . Bradycardia, severe sinus     Extreme; junctional bradycardia and sinus arrest up to 4 seconds    ROS:   All systems reviewed and negative except as noted in the HPI.   Past Surgical History   Procedure Laterality Date  . Cataract extraction    . Cholecystectomy    . Tonsillectomy    . Partial colectomy  2002    Partial colectomy for carcinoma of the colon-2002     Family History  Problem Relation Age of Onset  . Stroke Father      History   Social History  . Marital Status: Widowed    Spouse Name: N/A    Number of Children: 2  . Years of Education: N/A   Occupational History  . RETIRED     [employer]   Social History Main Topics  . Smoking status: Never Smoker   . Smokeless tobacco: Never Used  . Alcohol Use: No  . Drug Use: No  . Sexual Activity: Not on file   Other Topics Concern  . Not on file   Social History Narrative   Resides in [place].     BP 122/78  Pulse 78  Ht 5' 7.5" (1.715 m)  Wt 130 lb (58.968 kg)  BMI 20.05 kg/m2  SpO2 96%  Physical Exam:  Well appearing elderly woman, NAD HEENT: Unremarkable Neck:  7 cm JVD, no thyromegally Back:  No CVA tenderness Lungs:  Clear with no wheezes, rales, or rhonchi. HEART:  Regular rate rhythm, no murmurs,  no rubs, no clicks Abd:  soft, positive bowel sounds, no organomegally, no rebound, no guarding Ext:  2 plus pulses, no edema, no cyanosis, no clubbing Skin:  No rashes no nodules Neuro:  CN II through XII intact, motor grossly intact   DEVICE  Normal device function.  See PaceArt for details.   Assess/Plan:

## 2012-12-13 NOTE — Assessment & Plan Note (Signed)
Her heart failure symptoms are well compensated class II. She is encouraged to maintain a low-sodium diet, and she will continue her current medical therapy.

## 2012-12-13 NOTE — Assessment & Plan Note (Signed)
Her Medtronic dual-chamber pacemaker is working normally. We'll plan to recheck in several months. She is programmed to the VVI mode because of her chronic atrial fibrillation.

## 2012-12-13 NOTE — Patient Instructions (Signed)
Your physician recommends that you schedule a follow-up appointment in: 12 months with Dr Ladona Ridgel and 6 Months with paula

## 2013-01-17 ENCOUNTER — Ambulatory Visit (INDEPENDENT_AMBULATORY_CARE_PROVIDER_SITE_OTHER): Payer: Medicare Other | Admitting: *Deleted

## 2013-01-17 DIAGNOSIS — Z7901 Long term (current) use of anticoagulants: Secondary | ICD-10-CM

## 2013-01-17 DIAGNOSIS — I4892 Unspecified atrial flutter: Secondary | ICD-10-CM

## 2013-02-07 ENCOUNTER — Ambulatory Visit (INDEPENDENT_AMBULATORY_CARE_PROVIDER_SITE_OTHER): Payer: Medicare Other | Admitting: *Deleted

## 2013-02-07 DIAGNOSIS — Z7901 Long term (current) use of anticoagulants: Secondary | ICD-10-CM

## 2013-02-07 DIAGNOSIS — I4892 Unspecified atrial flutter: Secondary | ICD-10-CM

## 2013-02-07 LAB — POCT INR: INR: 2.7

## 2013-03-05 ENCOUNTER — Telehealth: Payer: Self-pay | Admitting: Internal Medicine

## 2013-03-05 DIAGNOSIS — I495 Sick sinus syndrome: Secondary | ICD-10-CM

## 2013-03-05 MED ORDER — METOPROLOL SUCCINATE ER 25 MG PO TB24: 25.0000 mg | ORAL_TABLET | Freq: Every day | ORAL | Status: AC

## 2013-03-05 NOTE — Telephone Encounter (Signed)
Received fax refill request  Rx # [id number] Medication:  Metoprolol Succinate 25 mg TER Qty 30 Sig:  Take one tablet by mouth every day Physician:  Ladona Ridgel

## 2013-03-05 NOTE — Telephone Encounter (Signed)
rx sent to pharmacy by e-script  

## 2013-03-07 ENCOUNTER — Ambulatory Visit (INDEPENDENT_AMBULATORY_CARE_PROVIDER_SITE_OTHER): Payer: Medicare Other | Admitting: *Deleted

## 2013-03-07 DIAGNOSIS — I4892 Unspecified atrial flutter: Secondary | ICD-10-CM

## 2013-03-07 DIAGNOSIS — Z7901 Long term (current) use of anticoagulants: Secondary | ICD-10-CM

## 2013-03-07 LAB — POCT INR: INR: 2.6

## 2013-04-04 ENCOUNTER — Ambulatory Visit (INDEPENDENT_AMBULATORY_CARE_PROVIDER_SITE_OTHER): Payer: Medicare Other | Admitting: *Deleted

## 2013-04-04 DIAGNOSIS — Z7901 Long term (current) use of anticoagulants: Secondary | ICD-10-CM

## 2013-04-04 DIAGNOSIS — I4892 Unspecified atrial flutter: Secondary | ICD-10-CM

## 2013-05-07 ENCOUNTER — Other Ambulatory Visit: Payer: Self-pay | Admitting: Internal Medicine

## 2013-05-08 NOTE — Telephone Encounter (Signed)
Received incoming call to refill pt lasix 20mg  QD for #30 with 6 refills per noted refill request sent to [place] office by mistake, gave verbal order for lasix refill to [facility] who advised she will fill and inform pt for pick up

## 2013-05-16 ENCOUNTER — Ambulatory Visit (INDEPENDENT_AMBULATORY_CARE_PROVIDER_SITE_OTHER): Payer: Medicare Other | Admitting: *Deleted

## 2013-05-16 DIAGNOSIS — Z7901 Long term (current) use of anticoagulants: Secondary | ICD-10-CM

## 2013-05-16 DIAGNOSIS — I4892 Unspecified atrial flutter: Secondary | ICD-10-CM

## 2013-05-16 DIAGNOSIS — Z5181 Encounter for therapeutic drug level monitoring: Secondary | ICD-10-CM | POA: Insufficient documentation

## 2013-05-16 LAB — POCT INR: INR: 2.5

## 2013-05-16 MED ORDER — WARFARIN SODIUM 1 MG PO TABS: ORAL_TABLET | ORAL | Status: DC

## 2013-05-16 MED ORDER — POTASSIUM CHLORIDE ER 10 MEQ PO TBCR: 10.0000 meq | EXTENDED_RELEASE_TABLET | Freq: Every day | ORAL | Status: DC

## 2013-06-15 ENCOUNTER — Ambulatory Visit (INDEPENDENT_AMBULATORY_CARE_PROVIDER_SITE_OTHER): Payer: Medicare Other | Admitting: *Deleted

## 2013-06-15 DIAGNOSIS — I4892 Unspecified atrial flutter: Secondary | ICD-10-CM

## 2013-06-15 LAB — MDC_IDC_ENUM_SESS_TYPE_INCLINIC
Battery Remaining Longevity: 120 mo
Battery Voltage: 2.79 V
Brady Statistic RV Percent Paced: 100 %
Lead Channel Impedance Value: 894 Ohm
Lead Channel Pacing Threshold Amplitude: 0.5 V
Lead Channel Pacing Threshold Pulse Width: 0.4 ms
Lead Channel Sensing Intrinsic Amplitude: 11.2 mV
Lead Channel Setting Pacing Amplitude: 2.5 V
Lead Channel Setting Pacing Pulse Width: 0.4 ms
Lead Channel Setting Sensing Sensitivity: 8 mV

## 2013-06-15 NOTE — Progress Notes (Signed)
PPM check in office. 

## 2013-06-27 ENCOUNTER — Ambulatory Visit (INDEPENDENT_AMBULATORY_CARE_PROVIDER_SITE_OTHER): Payer: Medicare Other | Admitting: *Deleted

## 2013-06-27 DIAGNOSIS — Z5181 Encounter for therapeutic drug level monitoring: Secondary | ICD-10-CM

## 2013-06-27 DIAGNOSIS — I4892 Unspecified atrial flutter: Secondary | ICD-10-CM

## 2013-06-27 DIAGNOSIS — Z7901 Long term (current) use of anticoagulants: Secondary | ICD-10-CM

## 2013-06-27 LAB — POCT INR: INR: 5

## 2013-06-28 ENCOUNTER — Encounter: Payer: Self-pay | Admitting: Internal Medicine

## 2013-06-29 ENCOUNTER — Emergency Department (HOSPITAL_COMMUNITY): Payer: Medicare Other

## 2013-06-29 ENCOUNTER — Encounter (HOSPITAL_COMMUNITY): Payer: Self-pay | Admitting: Emergency Medicine

## 2013-06-29 ENCOUNTER — Telehealth: Payer: Self-pay | Admitting: Internal Medicine

## 2013-06-29 ENCOUNTER — Inpatient Hospital Stay (HOSPITAL_COMMUNITY)
Admission: EM | Admit: 2013-06-29 | Discharge: 2013-07-05 | DRG: 291 | Disposition: A | Discharge status: Skilled Nursing Facility | Payer: Medicare Other | Attending: Internal Medicine | Admitting: Internal Medicine

## 2013-06-29 DIAGNOSIS — Z66 Do not resuscitate: Secondary | ICD-10-CM | POA: Diagnosis not present

## 2013-06-29 DIAGNOSIS — C786 Secondary malignant neoplasm of retroperitoneum and peritoneum: Secondary | ICD-10-CM | POA: Diagnosis present

## 2013-06-29 DIAGNOSIS — R0600 Dyspnea, unspecified: Secondary | ICD-10-CM | POA: Diagnosis present

## 2013-06-29 DIAGNOSIS — E860 Dehydration: Secondary | ICD-10-CM | POA: Diagnosis present

## 2013-06-29 DIAGNOSIS — C189 Malignant neoplasm of colon, unspecified: Secondary | ICD-10-CM | POA: Diagnosis present

## 2013-06-29 DIAGNOSIS — C7952 Secondary malignant neoplasm of bone marrow: Secondary | ICD-10-CM

## 2013-06-29 DIAGNOSIS — M1990 Unspecified osteoarthritis, unspecified site: Secondary | ICD-10-CM | POA: Diagnosis present

## 2013-06-29 DIAGNOSIS — C7951 Secondary malignant neoplasm of bone: Secondary | ICD-10-CM | POA: Diagnosis present

## 2013-06-29 DIAGNOSIS — D638 Anemia in other chronic diseases classified elsewhere: Secondary | ICD-10-CM | POA: Diagnosis present

## 2013-06-29 DIAGNOSIS — M6281 Muscle weakness (generalized): Secondary | ICD-10-CM

## 2013-06-29 DIAGNOSIS — H3530 Unspecified macular degeneration: Secondary | ICD-10-CM | POA: Diagnosis present

## 2013-06-29 DIAGNOSIS — IMO0002 Reserved for concepts with insufficient information to code with codable children: Secondary | ICD-10-CM

## 2013-06-29 DIAGNOSIS — Z823 Family history of stroke: Secondary | ICD-10-CM

## 2013-06-29 DIAGNOSIS — C799 Secondary malignant neoplasm of unspecified site: Secondary | ICD-10-CM | POA: Diagnosis present

## 2013-06-29 DIAGNOSIS — Z951 Presence of aortocoronary bypass graft: Secondary | ICD-10-CM

## 2013-06-29 DIAGNOSIS — E43 Unspecified severe protein-calorie malnutrition: Secondary | ICD-10-CM | POA: Diagnosis present

## 2013-06-29 DIAGNOSIS — T502X5A Adverse effect of carbonic-anhydrase inhibitors, benzothiadiazides and other diuretics, initial encounter: Secondary | ICD-10-CM | POA: Diagnosis not present

## 2013-06-29 DIAGNOSIS — I4891 Unspecified atrial fibrillation: Secondary | ICD-10-CM | POA: Diagnosis present

## 2013-06-29 DIAGNOSIS — I10 Essential (primary) hypertension: Secondary | ICD-10-CM | POA: Diagnosis present

## 2013-06-29 DIAGNOSIS — E440 Moderate protein-calorie malnutrition: Secondary | ICD-10-CM | POA: Diagnosis present

## 2013-06-29 DIAGNOSIS — Z950 Presence of cardiac pacemaker: Secondary | ICD-10-CM | POA: Diagnosis present

## 2013-06-29 DIAGNOSIS — R0602 Shortness of breath: Secondary | ICD-10-CM | POA: Diagnosis present

## 2013-06-29 DIAGNOSIS — Z7901 Long term (current) use of anticoagulants: Secondary | ICD-10-CM

## 2013-06-29 DIAGNOSIS — Z515 Encounter for palliative care: Secondary | ICD-10-CM

## 2013-06-29 DIAGNOSIS — Z8711 Personal history of peptic ulcer disease: Secondary | ICD-10-CM

## 2013-06-29 DIAGNOSIS — I509 Heart failure, unspecified: Secondary | ICD-10-CM | POA: Diagnosis present

## 2013-06-29 DIAGNOSIS — D72829 Elevated white blood cell count, unspecified: Secondary | ICD-10-CM | POA: Diagnosis present

## 2013-06-29 DIAGNOSIS — I5033 Acute on chronic diastolic (congestive) heart failure: Principal | ICD-10-CM | POA: Diagnosis present

## 2013-06-29 DIAGNOSIS — E039 Hypothyroidism, unspecified: Secondary | ICD-10-CM | POA: Diagnosis present

## 2013-06-29 DIAGNOSIS — C787 Secondary malignant neoplasm of liver and intrahepatic bile duct: Secondary | ICD-10-CM | POA: Diagnosis present

## 2013-06-29 DIAGNOSIS — N179 Acute kidney failure, unspecified: Secondary | ICD-10-CM | POA: Diagnosis present

## 2013-06-29 DIAGNOSIS — C7800 Secondary malignant neoplasm of unspecified lung: Secondary | ICD-10-CM | POA: Diagnosis present

## 2013-06-29 DIAGNOSIS — I4892 Unspecified atrial flutter: Secondary | ICD-10-CM | POA: Diagnosis present

## 2013-06-29 LAB — BASIC METABOLIC PANEL
BUN: 24 mg/dL — ABNORMAL HIGH (ref 6–23)
CO2: 22 mEq/L (ref 19–32)
Calcium: 8.1 mg/dL — ABNORMAL LOW (ref 8.4–10.5)
Chloride: 105 mEq/L (ref 96–112)
Creatinine, Ser: 1.14 mg/dL — ABNORMAL HIGH (ref 0.50–1.10)
GFR calc non Af Amer: 40 mL/min — ABNORMAL LOW (ref >90)
GFR, EST AFRICAN AMERICAN: 46 mL/min — AB (ref >90)
Glucose, Bld: 113 mg/dL — ABNORMAL HIGH (ref 70–99)
POTASSIUM: 5.2 meq/L (ref 3.7–5.3)
SODIUM: 141 meq/L (ref 137–147)

## 2013-06-29 LAB — URINALYSIS W MICROSCOPIC (NOT AT ARMC)
Bilirubin Urine: NEGATIVE
Glucose, UA: NEGATIVE mg/dL
Hgb urine dipstick: NEGATIVE
KETONES UR: NEGATIVE mg/dL
LEUKOCYTES UA: NEGATIVE
NITRITE: NEGATIVE
PH: 5.5 (ref 5.0–8.0)
Protein, ur: NEGATIVE mg/dL
Specific Gravity, Urine: 1.024 (ref 1.005–1.030)
Urobilinogen, UA: 0.2 mg/dL (ref 0.0–1.0)

## 2013-06-29 LAB — I-STAT TROPONIN, ED: TROPONIN I, POC: 0.02 ng/mL (ref 0.00–0.08)

## 2013-06-29 LAB — CBC
HCT: 38.4 % (ref 36.0–46.0)
Hemoglobin: 12.2 g/dL (ref 12.0–15.0)
MCH: 26.7 pg (ref 26.0–34.0)
MCHC: 31.8 g/dL (ref 30.0–36.0)
MCV: 84 fL (ref 78.0–100.0)
Platelets: 273 10*3/uL (ref 150–400)
RBC: 4.57 MIL/uL (ref 3.87–5.11)
RDW: 15.4 % (ref 11.5–15.5)
WBC: 16.9 10*3/uL — ABNORMAL HIGH (ref 4.0–10.5)

## 2013-06-29 LAB — PROTIME-INR
INR: 2.24 — ABNORMAL HIGH (ref 0.00–1.49)
Prothrombin Time: 24.1 seconds — ABNORMAL HIGH (ref 11.6–15.2)

## 2013-06-29 LAB — TROPONIN I: Troponin I: <0.3 ng/mL (ref <0.30)

## 2013-06-29 LAB — PRO B NATRIURETIC PEPTIDE: Pro B Natriuretic peptide (BNP): 2326 pg/mL — ABNORMAL HIGH (ref 0–450)

## 2013-06-29 MED ORDER — FUROSEMIDE 10 MG/ML IJ SOLN
40.0000 mg | Freq: Once | INTRAMUSCULAR | Status: AC
  Administered 2013-06-29: 40 mg via INTRAVENOUS
  Filled 2013-06-29: qty 4

## 2013-06-29 MED ORDER — ADULT MULTIVITAMIN W/MINERALS CH
1.0000 | ORAL_TABLET | Freq: Every day | ORAL | Status: DC
  Administered 2013-06-30 – 2013-07-05 (×6): 1 via ORAL
  Filled 2013-06-29 (×6): qty 1

## 2013-06-29 MED ORDER — SODIUM CHLORIDE 0.9 % IV SOLN
INTRAVENOUS | Status: DC
  Administered 2013-06-30: 02:00:00 via INTRAVENOUS

## 2013-06-29 MED ORDER — CENTRUM SILVER ULTRA WOMENS PO TABS: 1.0000 | ORAL_TABLET | Freq: Every day | ORAL | Status: DC

## 2013-06-29 MED ORDER — CALCIUM CARBONATE 600 MG PO TABS: 600.0000 mg | ORAL_TABLET | Freq: Every day | ORAL | Status: DC

## 2013-06-29 MED ORDER — WARFARIN SODIUM 1 MG PO TABS
1.0000 mg | ORAL_TABLET | Freq: Once | ORAL | Status: AC
  Administered 2013-06-29: 1 mg via ORAL
  Filled 2013-06-29: qty 1

## 2013-06-29 MED ORDER — FUROSEMIDE 20 MG PO TABS
20.0000 mg | ORAL_TABLET | Freq: Every day | ORAL | Status: DC
  Administered 2013-06-30 – 2013-07-01 (×2): 20 mg via ORAL
  Filled 2013-06-29 (×3): qty 1

## 2013-06-29 MED ORDER — POTASSIUM CHLORIDE ER 10 MEQ PO TBCR
10.0000 meq | EXTENDED_RELEASE_TABLET | Freq: Every day | ORAL | Status: DC
  Administered 2013-06-30 – 2013-07-05 (×6): 10 meq via ORAL
  Filled 2013-06-29 (×6): qty 1

## 2013-06-29 MED ORDER — IOHEXOL 350 MG/ML SOLN
80.0000 mL | Freq: Once | INTRAVENOUS | Status: AC | PRN
  Administered 2013-06-29: 56 mL via INTRAVENOUS

## 2013-06-29 MED ORDER — METOPROLOL SUCCINATE ER 25 MG PO TB24
25.0000 mg | ORAL_TABLET | Freq: Every day | ORAL | Status: DC
  Administered 2013-06-30 – 2013-07-05 (×6): 25 mg via ORAL
  Filled 2013-06-29 (×6): qty 1

## 2013-06-29 MED ORDER — LEVOTHYROXINE SODIUM 88 MCG PO TABS
88.0000 ug | ORAL_TABLET | Freq: Every day | ORAL | Status: DC
  Administered 2013-06-30 – 2013-07-05 (×6): 88 ug via ORAL
  Filled 2013-06-29 (×7): qty 1

## 2013-06-29 MED ORDER — WARFARIN - PHARMACIST DOSING INPATIENT
Freq: Every day | Status: DC
  Administered 2013-06-30 – 2013-07-04 (×4)

## 2013-06-29 MED ORDER — OCUVITE-LUTEIN PO CAPS
1.0000 | ORAL_CAPSULE | Freq: Every day | ORAL | Status: DC
  Administered 2013-06-30 – 2013-07-05 (×6): 1 via ORAL
  Filled 2013-06-29 (×6): qty 1

## 2013-06-29 MED ORDER — SODIUM CHLORIDE 0.9 % IJ SOLN
3.0000 mL | Freq: Two times a day (BID) | INTRAMUSCULAR | Status: DC
  Administered 2013-06-29 – 2013-07-04 (×11): 3 mL via INTRAVENOUS

## 2013-06-29 MED ORDER — PANTOPRAZOLE SODIUM 40 MG PO TBEC
40.0000 mg | DELAYED_RELEASE_TABLET | Freq: Every day | ORAL | Status: DC
  Administered 2013-06-30 – 2013-07-05 (×6): 40 mg via ORAL
  Filled 2013-06-29 (×5): qty 1

## 2013-06-29 MED ORDER — CALCIUM CARBONATE 1250 (500 CA) MG PO TABS
1.0000 | ORAL_TABLET | Freq: Every day | ORAL | Status: DC
  Administered 2013-06-30 – 2013-07-05 (×6): 500 mg via ORAL
  Filled 2013-06-29 (×7): qty 1

## 2013-06-29 MED ORDER — LEVOTHYROXINE SODIUM 88 MCG PO TABS: 88.0000 ug | ORAL_TABLET | Freq: Every day | ORAL | Status: DC

## 2013-06-29 NOTE — ED Notes (Signed)
Patient and family refused lasix as ordered notified Md and he will go and talk to them.

## 2013-06-29 NOTE — ED Notes (Signed)
Patient transported to CT 

## 2013-06-29 NOTE — Progress Notes (Signed)
ANTICOAGULATION CONSULT NOTE - Initial Consult  Pharmacy Consult for Coumadin Indication: atrial fibrillation  No Known Allergies  Patient Measurements:   Heparin Dosing Weight: n/a  Vital Signs: Temp: 98.6 F (37 C) (03/27 1923) Temp src: Oral (03/27 1923) BP: 135/69 mmHg (03/27 2100) Pulse Rate: 70 (03/27 2100)  Labs:  Recent Labs  06/27/13 1207 06/29/13 1434  HGB  --  12.2  HCT  --  38.4  PLT  --  273  LABPROT  --  24.1*  INR 5.0 2.24*  CREATININE  --  1.14*    The CrCl is unknown because both a height and weight (above a minimum accepted value) are required for this calculation.   Medical History: Past Medical History  Diagnosis Date  . Macular degeneration   . Peptic ulcer disease     with history of upper GI bleeding in 2004  . Degenerative joint disease     hip and lumbosacral spine  . Adenocarcinoma of colon   . Sick sinus syndrome     Paroxysmal atrial fibrillation with a rapid ventricular response;   . Atrial fibrillation   . Bradycardia, severe sinus     Extreme; junctional bradycardia and sinus arrest up to 4 seconds    Medications:   (Not in a hospital admission)  Assessment: 89 YOF presents to ED with increased SOB x 1 week with exertion. Also reports itermittent chest pain x 6 months. Hx of A.Fib, pacemaker. Pharmacy to dose coumadin. Last INR check was on 3/25 when INR was 5. Pt confirms she held coumadin for two days as was instructed. New pta coumadin dose is  1 mg daily except 2 mg on Tuesday and Saturday. INR today is therapeutic at 2.24. Hgb and Plt wnl. She reports no s/s of overt bleeding in the past two days. Will give low dose coumadin today. Anticipate INR to trend down tomorrow since dose was held for 2 days.  Goal of Therapy:  INR 2-3 Monitor platelets by anticoagulation protocol: Yes   Plan:  1) Coumadin 1 mg x 1 dose today.  2) Monitor daily INR and s/s of bleeding.    Albertina Parr, PharmD.  Clinical  Pharmacist Pager [phone]

## 2013-06-29 NOTE — Telephone Encounter (Signed)
Daughter calls to say her mom is quite SOB over past 7-10 days  but the last 2 days has become much worse.Denies CP,states her mom rarely complains but called her last night at 930pm to tell daughter she felt bad.Daughter wants to take her mom to [facility] ED for evaluation. i will forward this message to Dr.taylor

## 2013-06-29 NOTE — ED Notes (Signed)
MD at bedside for assessment

## 2013-06-29 NOTE — ED Notes (Signed)
Pt reports increased SOB x 1 week; worse with exertion. Also reports intermittent " nagging" chest pain x 6 months. Denies N/V/diaphoresis. Denies at this time. Hx: a. Fib, pacemaker. Takes coumadin. AO x4. NAD.

## 2013-06-29 NOTE — ED Notes (Signed)
Meal tray ordered 

## 2013-06-29 NOTE — Telephone Encounter (Signed)
Left message on pt VM 

## 2013-06-29 NOTE — H&P (Signed)
Hospitalist Admission History and Physical  Patient name: Wendy Garner Medical record number: [id number] Date of birth: 1915/10/01 Age: 80 y.o. Gender: female  Primary Care Provider: Purvis Kilts, MD  Chief Complaint: dyspnea, weakness  History of Present Illness:This is a 80 y.o. year old female with prior hx/o atrial flutter s/p pacemaker on coumadin, diastolic CHF, HTN, colon ca s/p partial colectomy 2002 presenting with dyspnea. Per the family, patient has had progressive shortness of breath over the past 2 weeks it has been worse in the past day. Patient is otherwise high functioning and lives at home alone. Does all her ADLs independently apart from having some unclear house area of the week. Patient's son checks on her daily. As noted patient having persistent shortness of breath at rest as well as with exertion as well as feeling mildly weak over the past week. Denies any fevers or chills. No nausea or vomiting. Baseline history of diastolic heart failure. Has been compliant with Lasix. Denies any high salt or NSAID intake. Denies any orthopnea or lower extremity swelling. No hemiparesis or confusion. No chest pain. No dysuria or diarrhea. Denies any recent falls at home, though there is a report of a remote fall in the past.   Presented to the ER with worsening symptoms today. Chest x-ray showed CABG changes with mild bibasilar effusions, enlarged cardiac silhouette with pulmonary vascular congestion as well as a questionable right upper lobe density. Followup CTA was negative for PE. There were multiple pulmonary nodules concerning for metastatic disease as well as some concerning liver nodules with no new sclerotic changes and T4 and T3 vertebrae also concerning for metastatic disease. Pro BNP at 2300. Patient noted to be satting between 95-100% on room air. No tachycardia or tachypnea. Blood pressure stable in the 110s to 130s. Afebrile. The white blood cell count 16.9. Plan was  for lasix in ER. However, family refused. Family felt that pt was going to be discharged home if she received lasix. They feel uncomfortable pt going home in current condition.   Patient Active Problem List   Diagnosis Date Noted  . Dyspnea 06/29/2013  . Encounter for therapeutic drug monitoring 05/16/2013  . Back pain 02/02/2011  . Chronic diastolic heart failure 40/98/1191  . Long term current use of anticoagulant 06/20/2010  . DYSPNEA 04/09/2010  . CARDIAC PACEMAKER IN SITU 08/21/2009  . ATRIAL FLUTTER 08/20/2009  . SICK SINUS SYNDROME 05/09/2009  . HYPERTENSION 04/17/2009  . KNEE, ARTHRITIS, DEGEN./OSTEO 04/17/2009  . ADENOCARCINOMA, COLON 04/16/2009  . MACULAR DEGENERATION 04/16/2009  . ARTHRITIS, HIP 04/16/2009  . PUD, HX OF 04/16/2009   Past Medical History: Past Medical History  Diagnosis Date  . Macular degeneration   . Peptic ulcer disease     with history of upper GI bleeding in 2004  . Degenerative joint disease     hip and lumbosacral spine  . Adenocarcinoma of colon   . Sick sinus syndrome     Paroxysmal atrial fibrillation with a rapid ventricular response;   . Atrial fibrillation   . Bradycardia, severe sinus     Extreme; junctional bradycardia and sinus arrest up to 4 seconds    Past Surgical History: Past Surgical History  Procedure Laterality Date  . Cataract extraction    . Cholecystectomy    . Tonsillectomy    . Partial colectomy  2002    Partial colectomy for carcinoma of the colon-2002    Social History: History   Social History  . Marital Status:  Widowed    Spouse Name: N/A    Number of Children: 2  . Years of Education: N/A   Occupational History  . RETIRED     [employer]   Social History Main Topics  . Smoking status: Never Smoker   . Smokeless tobacco: Never Used  . Alcohol Use: No  . Drug Use: No  . Sexual Activity: None   Other Topics Concern  . None   Social History Narrative   Resides in [place].     Family History: Family History  Problem Relation Age of Onset  . Stroke Father     Allergies: No Known Allergies  Current Facility-Administered Medications  Medication Dose Route Frequency Provider Last Rate Last Dose  . 0.9 %  sodium chloride infusion   Intravenous Continuous Shanda Howells, MD      . calcium carbonate (OS-CAL) tablet 600 mg  600 mg Oral Daily Shanda Howells, MD      . CENTRUM SILVER ULTRA WOMENS TABS 1 tablet  1 tablet Oral Daily Shanda Howells, MD      . furosemide (LASIX) tablet 20 mg  20 mg Oral Daily Shanda Howells, MD      . levothyroxine (SYNTHROID, LEVOTHROID) tablet 88 mcg  88 mcg Oral Daily Shanda Howells, MD      . metoprolol succinate (TOPROL-XL) 24 hr tablet 25 mg  25 mg Oral Daily Shanda Howells, MD      . multivitamin-lutein (OCUVITE-LUTEIN) capsule 1 capsule  1 capsule Oral Daily Shanda Howells, MD      . pantoprazole (PROTONIX) EC tablet 40 mg  40 mg Oral Daily Shanda Howells, MD      . potassium chloride (K-DUR) CR tablet 10 mEq  10 mEq Oral Daily Shanda Howells, MD      . sodium chloride 0.9 % injection 3 mL  3 mL Intravenous Q12H Shanda Howells, MD       Current Outpatient Prescriptions  Medication Sig Dispense Refill  . calcium carbonate (OS-CAL) 600 MG TABS Take 600 mg by mouth daily.       Marland Kitchen esomeprazole (NEXIUM) 20 MG capsule Take 20 mg by mouth daily at 12 noon.      . furosemide (LASIX) 20 MG tablet Take 20 mg by mouth daily.      Marland Kitchen levothyroxine (SYNTHROID, LEVOTHROID) 88 MCG tablet Take 88 mcg by mouth daily.      . metoprolol succinate (TOPROL-XL) 25 MG 24 hr tablet Take 1 tablet (25 mg total) by mouth daily.  30 tablet  6  . Multiple Vitamins-Minerals (CENTRUM SILVER ULTRA WOMENS PO) Take 1 tablet by mouth daily.        . multivitamin-lutein (OCUVITE-LUTEIN) CAPS Take 1 capsule by mouth daily.        . potassium chloride (K-DUR) 10 MEQ tablet Take 1 tablet (10 mEq total) by mouth daily.  30 tablet  6  . warfarin (COUMADIN) 1 MG tablet Take 2  tablets daily except 1 tablet on Tuesdays and Friday  60 tablet  6   Review Of Systems: 12 point ROS negative except as noted above in HPI.  Physical Exam: Filed Vitals:   06/29/13 2100  BP: 135/69  Pulse: 70  Temp:   Resp: 20    General: alert and cooperative HEENT: PERRLA and extra ocular movement intact Heart: S1, S2 normal, no murmur, rub or gallop, regular rate and rhythm Lungs: clear to auscultation, no wheezes or rales and unlabored breathing Abdomen: abdomen is soft without significant tenderness,  masses, organomegaly or guarding Extremities: 2+ peripheral pulses, trace edema  Skin:no rashes, no ecchymoses Neurology: normal without focal findings  Labs and Imaging: Lab Results  Component Value Date/Time   NA 141 06/29/2013  2:34 PM   K 5.2 06/29/2013  2:34 PM   CL 105 06/29/2013  2:34 PM   CO2 22 06/29/2013  2:34 PM   BUN 24* 06/29/2013  2:34 PM   CREATININE 1.14* 06/29/2013  2:34 PM   CREATININE 0.94 09/30/2010  2:35 PM   GLUCOSE 113* 06/29/2013  2:34 PM   Lab Results  Component Value Date   WBC 16.9* 06/29/2013   HGB 12.2 06/29/2013   HCT 38.4 06/29/2013   MCV 84.0 06/29/2013   PLT 273 06/29/2013   Urinalysis No results found for this basename: colorurine, appearanceur, labspec, phurine, glucoseu, hgbur, bilirubinur, ketonesur, proteinur, urobilinogen, nitrite, leukocytesur       Dg Chest 2 View  06/29/2013   CLINICAL DATA:  Shortness of breath for 2 weeks worse today, numbness in hands and feet, exhaustion, pacemaker, atrial fibrillation, on blood thinners, history colon cancer  EXAM: CHEST  2 VIEW  COMPARISON:  09/03/2010  FINDINGS: Left subclavian transvenous pacemaker leads project at right atrium and right ventricle, stable.  Enlargement of cardiac silhouette with pulmonary vascular congestion.  Calcification aortic arch.  Large hiatal hernia.  Emphysematous changes with bibasilar effusions and atelectasis.  Questionable nodular density right upper lobe 9 mm  diameter.  No definite infiltrate or pneumothorax.  New sclerotic upper thoracic vertebra proximally T5 highly worrisome for metastatic disease.  Degenerative disc disease changes at the thoracolumbar spine.  IMPRESSION: COPD changes with bibasilar effusions and atelectasis.  Enlargement of cardiac silhouette with pulmonary vascular congestion post pacemaker.  9 mm questionable right upper lobe nodular density.  New sclerotic upper thoracic vertebral body approximately T5 highly suspicious for osseous metastatic disease.  CT chest recommended to exclude pulmonary nodule/metastasis.   Electronically Signed   By: Lavonia Dana M.D.   On: 06/29/2013 15:09   Ct Angio Chest Pe W/cm &/or Wo Cm  06/29/2013   CLINICAL DATA:  Short of breath for 1 week. Chest pain for 6 months.  EXAM: CT ANGIOGRAPHY CHEST WITH CONTRAST  TECHNIQUE: Multidetector CT imaging of the chest was performed using the standard protocol during bolus administration of intravenous contrast. Multiplanar CT image reconstructions and MIPs were obtained to evaluate the vascular anatomy.  CONTRAST:  59mL OMNIPAQUE IOHEXOL 350 MG/ML SOLN  COMPARISON:  Current chest radiograph.  FINDINGS: No evidence of a pulmonary embolus.  There are multiple pulmonary nodules. Most are sub cm. Reference measurements were made of several. There is a discrete nodule arising from the apex of the left upper lobe on image 13 measuring 6.2 mm. There is an irregular nodule in the right upper lobe on image 34 measuring 8.5 mm. There is a discrete well-defined nodule in the right lower lobe on image 50 measuring 7.1 mm. There is an elongated nodular opacity in the left upper lobe posteriorly on image 36 measuring 13 mm. There is a focal area of opacity with irregular lobulated margins in the left upper lobe abutting the medial pleural margin. It measures 3.7 cm x 1.8 cm in greatest transverse dimensions. There are small, right greater than left, bilateral effusions. There are areas  of coarse reticular opacity mostly in the lung bases likely a combination of scarring and subsegmental atelectasis.  The heart is mildly enlarged. No mediastinal or hilar masses or pathologically enlarged lymph  nodes are seen. There is a moderate to large hiatal hernia.  Below the diaphragm the liver shows morphologic changes of advanced cirrhosis. Irregular hypoattenuation occupies the majority of the visualize right lower lobe and caudate lobe. The spleen is enlarged. There is a possible right adrenal mass. This is more likely fluid adjacent to the normal right adrenal gland.  There is ascites. There is nodularity throughout the visualized omentum and in the peritoneum extending to the herniated peritoneal fat. This is highly suspicious for peritoneal carcinomatosis.  There is sclerosis throughout the entire T4 vertebrae without loss of vertebral height mild sclerosis is seen in the T3 vertebrae. No osteolytic lesions.  Review of the MIP images confirms the above findings.  IMPRESSION: 1. No evidence of a pulmonary embolus. 2. Findings are highly suspicious for neoplastic/metastatic disease. There are multiple pulmonary nodules. There is abnormal hypoattenuation throughout much of the liver and there is evidence of peritoneal carcinomatosis. There is also a sclerotic T4 vertebrae with some small areas of sclerosis in the T3 vertebrae. Possibilities include metastatic disease from breast carcinoma. Hepatocellular carcinoma may be the primary, given the underlying changes of cirrhosis. 3. There is also cardiomegaly and small, right greater than left, pleural effusions. Cardiac decompensation may be responsible for the shortness of breath.   Electronically Signed   By: Lajean Manes M.D.   On: 06/29/2013 17:24   EKG: Paced rhythm. Rate of 75 beats per minute.  Assessment and Plan: ELIANNA WINDOM is a 80 y.o. year old female presenting with dyspnea, weakness.   Dyspnea/Weakness: likely multifactorial  contributions of CHF exacerbation and metastatic disease. We'll gently diurese patient with Lasix and assess for symptomatic improvement. Patient's overall fairly well apparent clinically. No hypoxia or tachycardia. Hemodynamically stable. Check baseline labs including TSH, vitamin D, pre-albumin. PT OT consult. Will need to have discussed with family about overall treatment plan/options for metastatic disease.  Diastolic CHF: Gentle diuresis with Lasix. Minimally to mildly volume overloaded on exam. Ins and outs and daily weights. Cycle cardiac enzymes. Continue home medication regimen. Also check 2-D echo to evaluate for any possible progression of heart failure. Coumadin per pharmacy.   Leukocytosis: Noted fairly elevated white blood cell count on presentation. No current signs of infection. Chest x-ray/CT without any infectious infiltrate. Urinalysis is pending. We'll check blood and urine cultures. Question reactive leukocytosis in the setting of metastatic disease. We'll trend.   FEN/GI: heart healthy diet. PPI Prophylaxis: coumadin Disposition: pending further evaluation  Code Status:DNR        Shanda Howells MD  Pager: [phone]

## 2013-06-30 DIAGNOSIS — N179 Acute kidney failure, unspecified: Secondary | ICD-10-CM | POA: Diagnosis present

## 2013-06-30 DIAGNOSIS — C799 Secondary malignant neoplasm of unspecified site: Secondary | ICD-10-CM | POA: Diagnosis present

## 2013-06-30 DIAGNOSIS — D638 Anemia in other chronic diseases classified elsewhere: Secondary | ICD-10-CM | POA: Diagnosis present

## 2013-06-30 DIAGNOSIS — E440 Moderate protein-calorie malnutrition: Secondary | ICD-10-CM | POA: Diagnosis present

## 2013-06-30 DIAGNOSIS — R0602 Shortness of breath: Secondary | ICD-10-CM | POA: Diagnosis present

## 2013-06-30 LAB — CBC WITH DIFFERENTIAL/PLATELET
Basophils Absolute: 0 10*3/uL (ref 0.0–0.1)
Basophils Relative: 0 % (ref 0–1)
Eosinophils Absolute: 0.3 10*3/uL (ref 0.0–0.7)
Eosinophils Relative: 2 % (ref 0–5)
HCT: 34.7 % — ABNORMAL LOW (ref 36.0–46.0)
HEMOGLOBIN: 11.2 g/dL — AB (ref 12.0–15.0)
Lymphocytes Relative: 6 % — ABNORMAL LOW (ref 12–46)
Lymphs Abs: 0.9 10*3/uL (ref 0.7–4.0)
MCH: 26.6 pg (ref 26.0–34.0)
MCHC: 32.3 g/dL (ref 30.0–36.0)
MCV: 82.4 fL (ref 78.0–100.0)
MONOS PCT: 9 % (ref 3–12)
Monocytes Absolute: 1.2 10*3/uL — ABNORMAL HIGH (ref 0.1–1.0)
NEUTROS ABS: 10.9 10*3/uL — AB (ref 1.7–7.7)
Neutrophils Relative %: 83 % — ABNORMAL HIGH (ref 43–77)
Platelets: 241 10*3/uL (ref 150–400)
RBC: 4.21 MIL/uL (ref 3.87–5.11)
RDW: 15.4 % (ref 11.5–15.5)
WBC: 13.2 10*3/uL — ABNORMAL HIGH (ref 4.0–10.5)

## 2013-06-30 LAB — PROTIME-INR
INR: 2.12 — AB (ref 0.00–1.49)
Prothrombin Time: 23.1 seconds — ABNORMAL HIGH (ref 11.6–15.2)

## 2013-06-30 LAB — TSH: TSH: 9.88 u[IU]/mL — ABNORMAL HIGH (ref 0.350–4.500)

## 2013-06-30 LAB — COMPREHENSIVE METABOLIC PANEL
ALT: 13 U/L (ref 0–35)
AST: 24 U/L (ref 0–37)
Albumin: 2.5 g/dL — ABNORMAL LOW (ref 3.5–5.2)
Alkaline Phosphatase: 170 U/L — ABNORMAL HIGH (ref 39–117)
BUN: 22 mg/dL (ref 6–23)
CHLORIDE: 102 meq/L (ref 96–112)
CO2: 23 mEq/L (ref 19–32)
CREATININE: 1.12 mg/dL — AB (ref 0.50–1.10)
Calcium: 7.8 mg/dL — ABNORMAL LOW (ref 8.4–10.5)
GFR calc Af Amer: 47 mL/min — ABNORMAL LOW (ref >90)
GFR calc non Af Amer: 40 mL/min — ABNORMAL LOW (ref >90)
GLUCOSE: 105 mg/dL — AB (ref 70–99)
Potassium: 4.3 mEq/L (ref 3.7–5.3)
Sodium: 139 mEq/L (ref 137–147)
Total Bilirubin: 0.6 mg/dL (ref 0.3–1.2)
Total Protein: 5.5 g/dL — ABNORMAL LOW (ref 6.0–8.3)

## 2013-06-30 LAB — PREALBUMIN: Prealbumin: 9.7 mg/dL — ABNORMAL LOW (ref 17.0–34.0)

## 2013-06-30 LAB — TROPONIN I: Troponin I: <0.3 ng/mL (ref <0.30)

## 2013-06-30 MED ORDER — FUROSEMIDE 10 MG/ML IJ SOLN: 20.0000 mg | Freq: Once | INTRAMUSCULAR | Status: DC

## 2013-06-30 MED ORDER — ENSURE COMPLETE PO LIQD
237.0000 mL | Freq: Two times a day (BID) | ORAL | Status: DC
  Administered 2013-07-01 – 2013-07-04 (×6): 237 mL via ORAL

## 2013-06-30 MED ORDER — WARFARIN SODIUM 2 MG PO TABS
2.0000 mg | ORAL_TABLET | Freq: Once | ORAL | Status: AC
  Administered 2013-06-30: 2 mg via ORAL
  Filled 2013-06-30: qty 1

## 2013-06-30 NOTE — ED Provider Notes (Signed)
CSN: [id number]     Arrival date & time 06/29/13  1423 History   First MD Initiated Contact with Patient 06/29/13 1546     Chief Complaint  Patient presents with  . Chest Pain  . Shortness of Breath     (Consider location/radiation/quality/duration/timing/severity/associated sxs/prior Treatment) HPI Comments: Patient presents to the ER for evaluation of difficulty breathing. Symptoms have been ongoing for at least a week. Initially she was short of breath simply with exertion, over the last one or 2 days, however, she is short of breath at rest. She does not have a history of pulmonary disease. Patient does report chest pain, but this has been a nagging, intermittent, mild pain that has been ongoing for approximately 6 months.  Patient is a 80 y.o. female presenting with chest pain and shortness of breath.  Chest Pain Associated symptoms: shortness of breath   Shortness of Breath Associated symptoms: chest pain     Past Medical History  Diagnosis Date  . Macular degeneration   . Peptic ulcer disease     with history of upper GI bleeding in 2004  . Degenerative joint disease     hip and lumbosacral spine  . Adenocarcinoma of colon   . Sick sinus syndrome     Paroxysmal atrial fibrillation with a rapid ventricular response;   . Atrial fibrillation   . Bradycardia, severe sinus     Extreme; junctional bradycardia and sinus arrest up to 4 seconds   Past Surgical History  Procedure Laterality Date  . Cataract extraction    . Cholecystectomy    . Tonsillectomy    . Partial colectomy  2002    Partial colectomy for carcinoma of the colon-2002   Family History  Problem Relation Age of Onset  . Stroke Father    History  Substance Use Topics  . Smoking status: Never Smoker   . Smokeless tobacco: Never Used  . Alcohol Use: No   OB History   Grav Para Term Preterm Abortions TAB SAB Ect Mult Living                 Review of Systems  Respiratory: Positive for shortness of  breath.   Cardiovascular: Positive for chest pain.  All other systems reviewed and are negative.      Allergies  Review of patient's allergies indicates no known allergies.  Home Medications  No current outpatient prescriptions on file. BP 105/44  Pulse 72  Temp(Src) 98.2 F (36.8 C) (Oral)  Resp 18  Ht 5\' 8"  (1.727 m)  Wt 131 lb 1.2 oz (59.456 kg)  BMI 19.93 kg/m2  SpO2 96% Physical Exam  Constitutional: She is oriented to person, place, and time. She appears well-developed and well-nourished. No distress.  HENT:  Head: Normocephalic and atraumatic.  Right Ear: Hearing normal.  Left Ear: Hearing normal.  Nose: Nose normal.  Mouth/Throat: Oropharynx is clear and moist and mucous membranes are normal.  Eyes: Conjunctivae and EOM are normal. Pupils are equal, round, and reactive to light.  Neck: Normal range of motion. Neck supple.  Cardiovascular: Regular rhythm, S1 normal and S2 normal.  Exam reveals no gallop and no friction rub.   No murmur heard. Pulmonary/Chest: Breath sounds normal. Accessory muscle usage present. Tachypnea noted. No respiratory distress. She exhibits no tenderness.  Abdominal: Soft. Normal appearance and bowel sounds are normal. There is no hepatosplenomegaly. There is no tenderness. There is no rebound, no guarding, no tenderness at McBurney's point and negative Murphy's sign.  No hernia.  Musculoskeletal: Normal range of motion.  Neurological: She is alert and oriented to person, place, and time. She has normal strength. No cranial nerve deficit or sensory deficit. Coordination normal. GCS eye subscore is 4. GCS verbal subscore is 5. GCS motor subscore is 6.  Skin: Skin is warm, dry and intact. No rash noted. No cyanosis.  Psychiatric: She has a normal mood and affect. Her speech is normal and behavior is normal. Thought content normal.    ED Course  Procedures (including critical care time) Labs Review Labs Reviewed  CBC - Abnormal; Notable for  the following:    WBC 16.9 (*)    All other components within normal limits  BASIC METABOLIC PANEL - Abnormal; Notable for the following:    Glucose, Bld 113 (*)    BUN 24 (*)    Creatinine, Ser 1.14 (*)    Calcium 8.1 (*)    GFR calc non Af Amer 40 (*)    GFR calc Af Amer 46 (*)    All other components within normal limits  PRO B NATRIURETIC PEPTIDE - Abnormal; Notable for the following:    Pro B Natriuretic peptide (BNP) 2326.0 (*)    All other components within normal limits  PROTIME-INR - Abnormal; Notable for the following:    Prothrombin Time 24.1 (*)    INR 2.24 (*)    All other components within normal limits  CBC WITH DIFFERENTIAL - Abnormal; Notable for the following:    WBC 13.2 (*)    Hemoglobin 11.2 (*)    HCT 34.7 (*)    Neutrophils Relative % 83 (*)    Neutro Abs 10.9 (*)    Lymphocytes Relative 6 (*)    Monocytes Absolute 1.2 (*)    All other components within normal limits  COMPREHENSIVE METABOLIC PANEL - Abnormal; Notable for the following:    Glucose, Bld 105 (*)    Creatinine, Ser 1.12 (*)    Calcium 7.8 (*)    Total Protein 5.5 (*)    Albumin 2.5 (*)    Alkaline Phosphatase 170 (*)    GFR calc non Af Amer 40 (*)    GFR calc Af Amer 47 (*)    All other components within normal limits  PREALBUMIN - Abnormal; Notable for the following:    Prealbumin 9.7 (*)    All other components within normal limits  URINALYSIS W MICROSCOPIC - Abnormal; Notable for the following:    Bacteria, UA FEW (*)    Casts HYALINE CASTS (*)    All other components within normal limits  PROTIME-INR - Abnormal; Notable for the following:    Prothrombin Time 23.1 (*)    INR 2.12 (*)    All other components within normal limits  URINE CULTURE  TROPONIN I  TROPONIN I  TROPONIN I  VITAMIN D 1,25 DIHYDROXY  VITAMIN D 25 HYDROXY  TSH  I-STAT TROPOININ, ED   Imaging Review Dg Chest 2 View  06/29/2013   CLINICAL DATA:  Shortness of breath for 2 weeks worse today, numbness in  hands and feet, exhaustion, pacemaker, atrial fibrillation, on blood thinners, history colon cancer  EXAM: CHEST  2 VIEW  COMPARISON:  09/03/2010  FINDINGS: Left subclavian transvenous pacemaker leads project at right atrium and right ventricle, stable.  Enlargement of cardiac silhouette with pulmonary vascular congestion.  Calcification aortic arch.  Large hiatal hernia.  Emphysematous changes with bibasilar effusions and atelectasis.  Questionable nodular density right upper lobe 9 mm  diameter.  No definite infiltrate or pneumothorax.  New sclerotic upper thoracic vertebra proximally T5 highly worrisome for metastatic disease.  Degenerative disc disease changes at the thoracolumbar spine.  IMPRESSION: COPD changes with bibasilar effusions and atelectasis.  Enlargement of cardiac silhouette with pulmonary vascular congestion post pacemaker.  9 mm questionable right upper lobe nodular density.  New sclerotic upper thoracic vertebral body approximately T5 highly suspicious for osseous metastatic disease.  CT chest recommended to exclude pulmonary nodule/metastasis.   Electronically Signed   By: Lavonia Dana M.D.   On: 06/29/2013 15:09   Ct Angio Chest Pe W/cm &/or Wo Cm  06/29/2013   CLINICAL DATA:  Short of breath for 1 week. Chest pain for 6 months.  EXAM: CT ANGIOGRAPHY CHEST WITH CONTRAST  TECHNIQUE: Multidetector CT imaging of the chest was performed using the standard protocol during bolus administration of intravenous contrast. Multiplanar CT image reconstructions and MIPs were obtained to evaluate the vascular anatomy.  CONTRAST:  21mL OMNIPAQUE IOHEXOL 350 MG/ML SOLN  COMPARISON:  Current chest radiograph.  FINDINGS: No evidence of a pulmonary embolus.  There are multiple pulmonary nodules. Most are sub cm. Reference measurements were made of several. There is a discrete nodule arising from the apex of the left upper lobe on image 13 measuring 6.2 mm. There is an irregular nodule in the right upper lobe on  image 34 measuring 8.5 mm. There is a discrete well-defined nodule in the right lower lobe on image 50 measuring 7.1 mm. There is an elongated nodular opacity in the left upper lobe posteriorly on image 36 measuring 13 mm. There is a focal area of opacity with irregular lobulated margins in the left upper lobe abutting the medial pleural margin. It measures 3.7 cm x 1.8 cm in greatest transverse dimensions. There are small, right greater than left, bilateral effusions. There are areas of coarse reticular opacity mostly in the lung bases likely a combination of scarring and subsegmental atelectasis.  The heart is mildly enlarged. No mediastinal or hilar masses or pathologically enlarged lymph nodes are seen. There is a moderate to large hiatal hernia.  Below the diaphragm the liver shows morphologic changes of advanced cirrhosis. Irregular hypoattenuation occupies the majority of the visualize right lower lobe and caudate lobe. The spleen is enlarged. There is a possible right adrenal mass. This is more likely fluid adjacent to the normal right adrenal gland.  There is ascites. There is nodularity throughout the visualized omentum and in the peritoneum extending to the herniated peritoneal fat. This is highly suspicious for peritoneal carcinomatosis.  There is sclerosis throughout the entire T4 vertebrae without loss of vertebral height mild sclerosis is seen in the T3 vertebrae. No osteolytic lesions.  Review of the MIP images confirms the above findings.  IMPRESSION: 1. No evidence of a pulmonary embolus. 2. Findings are highly suspicious for neoplastic/metastatic disease. There are multiple pulmonary nodules. There is abnormal hypoattenuation throughout much of the liver and there is evidence of peritoneal carcinomatosis. There is also a sclerotic T4 vertebrae with some small areas of sclerosis in the T3 vertebrae. Possibilities include metastatic disease from breast carcinoma. Hepatocellular carcinoma may be the  primary, given the underlying changes of cirrhosis. 3. There is also cardiomegaly and small, right greater than left, pleural effusions. Cardiac decompensation may be responsible for the shortness of breath.   Electronically Signed   By: Lajean Manes M.D.   On: 06/29/2013 17:24   EKG: V-pacing   MDM   Final diagnoses:  Shortness of breath  Acute renal failure  Anemia of chronic disease  Colon cancer metastasized to multiple sites    Patient presents to the ER for evaluation of shortness of breath. Patient has had progressive worsening of her dyspnea over a period of one week. She does have a history of atrial fibrillation, and status post pacemaker placement. She is on chronic Coumadin. She had supratherapeutic INRs earlier in the week and did hold the Coumadin earlier in the week, but INR is therapeutic today. It was therefore felt that PE was less likely, but still possible.  She does take Lasix. Congestive heart failure was considered in the diagnosis. Her lungs were fairly clear on examination, however. BNP was elevated but chest x-ray did not show any obvious increased markings. This may be PE more of a consideration. There is also, however, evidence of a nodule. CT angiography was performed to evaluate for PE, the aorta, as well as further Leta nodule. Findings are consistent with metastatic cancer. Patient does have a distant history of colon cancer.  Patient lives alone. She is anticoagulated and a fall risk. Family was uncomfortable with a discharge from the hospital. Although she is not hypoxic, she is dyspneic and gets very short and it even just speaking, sitting at rest. It is felt that this is multifactorial, probably volume overload as well as the metastatic cancer. Diuresis initiated and hospitalist asked to admit the patient.   Orpah Greek, MD 06/30/13 [phone]

## 2013-06-30 NOTE — Evaluation (Addendum)
Occupational Therapy Evaluation Patient Details Name: Wendy Garner MRN: [id number] DOB: Apr 09, 1915 Today's Date: 06/30/2013    History of Present Illness Adm 3/27 with dyspnea. ?CHF vs metastatic disease (h/o colon Ca with mets to lungs and bones, T3-4)   Clinical Impression   Pt presents with below problem list. Pt lived at home alone and was independent with ADLs. Feel pt will benefit from acute OT to increase independence prior to d/c.     Follow Up Recommendations  SNF;Supervision/Assistance - 24 hour    Equipment Recommendations  Other (comment) (defer to next venue)    Recommendations for Other Services       Precautions / Restrictions Precautions Precautions: Fall      Mobility Bed Mobility Overal bed mobility: Needs Assistance Bed Mobility: Supine to Sit;Sit to Supine     Supine to sit: Modified independent (Device/Increase time) Sit to supine: Supervision (cues for technique to scoot HOB)   General bed mobility comments: cues for technique to scoot HOB.  Transfers Overall transfer level: Needs assistance Equipment used: Rolling walker (2 wheeled);1 person hand held assist Transfers: Sit to/from Stand Sit to Stand: Min guard;Min assist         General transfer comment: Cues for technique.    Balance                                    ADL   Grooming: Oral care;Standing;Wash/dry face (Min guard)   Upper Body Dressing : Set up;Sitting   Lower Body Dressing: Min guard;Sit to/from stand Toilet Transfer: Min guard;Ambulation;Regular Toilet;Grab bars Toileting- Clothing Manipulation and Hygiene: Minimal assistance;Sit to/from stand (to clean more thoroughly)   Functional mobility during ADLs: Min guard General ADL Comments: Educated on energy conservation techniques. Pt fatigues quickly. Educated on use of bag on walker to carry items. Discussed safe shoewear. Performed grooming at sink requiring rest break. Recommended sitting for  bathing and dressing.     Vision                     Perception     Praxis      Pertinent Vitals/Pain No pain reported. O2 remained in 90's on RA.      Hand Dominance     Extremity/Trunk Assessment Upper Extremity Assessment Upper Extremity Assessment: Generalized weakness   Lower Extremity Assessment Lower Extremity Assessment: Generalized weakness   Cervical / Trunk Assessment Cervical / Trunk Assessment: Kyphotic   Communication Communication Communication: HOH   Cognition Arousal/Alertness: Awake/alert Behavior During Therapy: WFL for tasks assessed/performed Overall Cognitive Status: Within Functional Limits for tasks assessed                     General Comments       Exercises      Home Living Family/patient expects to be discharged to:: Private residence Living Arrangements: Alone Available Help at Discharge: Family;Available PRN/intermittently Type of Home: House Home Access: Stairs to enter [employer] of Steps: 2 Entrance Stairs-Rails: None (holds door) Home Layout: One level     Bathroom Shower/Tub: Teacher, early years/pre: Standard     Home Equipment: Cane - single point;Walker - 2 wheels   Additional Comments: reports both her kids have families      Prior Functioning/Environment Level of Independence: Independent (cleaning lady comes every two weeks)       Comments: usually uses cane outside, holds  furniture indoors; cleaning lady comes every 2 weeks    OT Diagnosis:     OT Problem List: Decreased strength;Decreased activity tolerance;Impaired balance (sitting and/or standing);Decreased knowledge of use of DME or AE;Decreased knowledge of precautions;Cardiopulmonary status limiting activity   OT Treatment/Interventions: Self-care/ADL training;Therapeutic exercise;DME and/or AE instruction;Energy conservation;Therapeutic activities;Patient/family education;Balance training;Visual/perceptual  remediation/compensation    OT Goals(Current goals can be found in the care plan section) Acute Rehab OT Goals Patient Stated Goal: get back to ner normal life OT Goal Formulation: With patient Time For Goal Achievement: 07/07/13 Potential to Achieve Goals: Good ADL Goals Pt Will Perform Grooming: with modified independence;standing Pt Will Perform Upper Body Bathing: with modified independence;sitting Pt Will Perform Lower Body Bathing: with modified independence;sit to/from stand Pt Will Transfer to Toilet: with modified independence;ambulating (3 in 1 over commode) Pt Will Perform Toileting - Clothing Manipulation and hygiene: with modified independence;sit to/from stand Additional ADL Goal #1: Pt will independently verbalize 3/3 energy conservation techniques.  OT Frequency: Min 2X/week   Barriers to D/C: Decreased caregiver support          End of Session: Equipment Utilized During Treatment: Gait belt;Rolling walker  Activity Tolerance: Patient limited by fatigue Patient left: in bed;with call bell/phone within reach;with bed alarm set   Time: 1884-1660 OT Time Calculation (min): 26 min Charges:  OT General Charges $OT Visit: 1 Procedure OT Evaluation $Initial OT Evaluation Tier I: 1 Procedure OT Treatments $Self Care/Home Management : 8-22 mins G-Codes: OT G-codes **NOT FOR INPATIENT CLASS** Functional Assessment Tool Used: clinical judgment Functional Limitation: Self care Self Care Current Status (Y3016): At least 20 percent but less than 40 percent impaired, limited or restricted Self Care Goal Status (W1093): 0 percent impaired, limited or restricted  Benito Mccreedy OTR/L [phone] 06/30/2013, 6:07 PM

## 2013-06-30 NOTE — Progress Notes (Signed)
ANTICOAGULATION CONSULT NOTE - Follow Up Consult  Pharmacy Consult for Coumadin Indication: atrial fibrillation  No Known Allergies  Patient Measurements: Height: 5\' 8"  (172.7 cm) Weight: 131 lb 1.2 oz (59.456 kg) IBW/kg (Calculated) : 63.9  Vital Signs: Temp: 98.2 F (36.8 C) (03/28 1021) Temp src: Oral (03/28 1021) BP: 108/62 mmHg (03/28 1021) Pulse Rate: 76 (03/28 1021)  Labs:  Recent Labs  06/27/13 1207 06/29/13 1434 06/29/13 2147 06/30/13 0335 06/30/13 0502 06/30/13 0925  HGB  --  12.2  --   --  11.2*  --   HCT  --  38.4  --   --  34.7*  --   PLT  --  273  --   --  241  --   LABPROT  --  24.1*  --   --  23.1*  --   INR 5.0 2.24*  --   --  2.12*  --   CREATININE  --  1.14*  --   --  1.12*  --   TROPONINI  --   --  <0.30 <0.30  --  <0.30    Estimated Creatinine Clearance: 28.2 ml/min (by C-G formula based on Cr of 1.12).   Assessment: 80 y/o female on chronic Coumadin for Afib. INR is therapeutic but has trended down as expected after holding 2 doses on 3/25 and 3/26 for supratherapeutic INR. No bleeding noted, CBC stable.  New home Coumadin dose is 1 mg daily except 2 mg on Tuesday and Saturday. Had been on 2 mg daily except 1 mg on Tuesday and Friday prior to 3/25.  Goal of Therapy:  INR 2-3 Monitor platelets by anticoagulation protocol: Yes   Plan:  - Coumadin 2 mg PO tonight - INR daily - Monitor for s/sx of bleeding  [facility], Pharm.D., BCPS Clinical Pharmacist Pager: [phone] 06/30/2013 11:01 AM

## 2013-06-30 NOTE — Progress Notes (Signed)
Utilization review completed.  P.J. Faisal Stradling,RN,BSN Case Manager 

## 2013-06-30 NOTE — Evaluation (Signed)
Physical Therapy Evaluation Patient Details Name: Wendy Garner MRN: [id number] DOB: 16-Jun-1915 Today's Date: 06/30/2013   History of Present Illness  Adm 3/27 with dyspnea. ?CHF vs metastatic disease (h/o colon Ca with mets to lungs and bones, T3-4)  Clinical Impression  Pt admitted with dyspnea. Pt has been very independent at home--typically using no device vs single point cane. Currently fatigues quickly with dyspnea 3/4 and using RW for support. Pt currently with functional limitations due to the deficits listed below (see PT Problem List).  Pt will benefit from skilled PT to increase their independence and safety with mobility to allow discharge to the venue listed below.       Follow Up Recommendations SNF;Supervision/Assistance - 24 hour  Pt ultimately wants to d/c home alone, however recognizes that in her current state she'll need to go to SNF. She hopes to stay in hospital long enough to be able to go home. Will continue to assess.    Equipment Recommendations  None recommended by PT    Recommendations for Other Services OT consult     Precautions / Restrictions Precautions Precautions: Fall      Mobility  Bed Mobility Overal bed mobility: Needs Assistance Bed Mobility: Rolling;Sidelying to Sit Rolling: Modified independent (Device/Increase time) (wtih rail) Sidelying to sit: Supervision;HOB elevated (HOB 20, rail)       General bed mobility comments: effortful and near need for assist  Transfers Overall transfer level: Needs assistance Equipment used: Rolling walker (2 wheeled);1 person hand held assist Transfers: Sit to/from Stand Sit to Stand: Min assist         General transfer comment: pt normally uses cane; x 1 with Rt HHA to stand, however pt reaching for support with LUE; x 1 with RW with vc for proper use  Ambulation/Gait Ambulation/Gait assistance: Min guard Ambulation Distance (Feet): 30 Feet Assistive device: Rolling walker (2  wheeled) Gait Pattern/deviations: Step-through pattern;Decreased stride length;Trunk flexed     General Gait Details: very kyphotic with RW pushed ahead of her (able to correct and partially reverse kyphosis, however then returns to original posture).  Stairs            Wheelchair Mobility    Modified Rankin (Stroke Patients Only)       Balance Overall balance assessment: Needs assistance Sitting-balance support: No upper extremity supported;Feet supported Sitting balance-Leahy Scale: Good     Standing balance support: Single extremity supported Standing balance-Leahy Scale: Poor                       Pertinent Vitals/Pain SaO2 95% on RA at rest            93% on RA with activity    Home Living Family/patient expects to be discharged to:: Private residence Living Arrangements: Alone Available Help at Discharge: Family;Available PRN/intermittently Type of Home: House Home Access: Stairs to enter Entrance Stairs-Rails: None (holds door) Technical brewer of Steps: 2 Home Layout: One level Home Equipment: Cane - single point;Walker - 2 wheels Additional Comments: reports both her kids have families    Prior Function Level of Independence: Independent         Comments: usually uses cane outside, holds furniture indoors; cleaning lady comes every 2 weeks     Hand Dominance        Extremity/Trunk Assessment   Upper Extremity Assessment: Generalized weakness           Lower Extremity Assessment: Generalized weakness  Cervical / Trunk Assessment: Kyphotic  Communication   Communication: HOH  Cognition Arousal/Alertness: Awake/alert Behavior During Therapy: WFL for tasks assessed/performed Overall Cognitive Status: Within Functional Limits for tasks assessed                      General Comments General comments (skin integrity, edema, etc.): Pt reports she would choose to go to SNF if she is unsafe to return home  alone. Does not want to burden her kids    Exercises        Assessment/Plan    PT Assessment Patient needs continued PT services  PT Diagnosis Difficulty walking;Generalized weakness   PT Problem List Decreased strength;Decreased activity tolerance;Decreased balance;Decreased mobility;Decreased knowledge of use of DME;Cardiopulmonary status limiting activity  PT Treatment Interventions DME instruction;Gait training;Stair training;Functional mobility training;Therapeutic activities;Balance training;Therapeutic exercise;Patient/family education   PT Goals (Current goals can be found in the Care Plan section) Acute Rehab PT Goals Patient Stated Goal: wants to return home PT Goal Formulation: With patient Time For Goal Achievement: 07/07/13 Potential to Achieve Goals: Good    Frequency Min 3X/week   Barriers to discharge Decreased caregiver support      End of Session Equipment Utilized During Treatment: Gait belt Activity Tolerance: Patient limited by fatigue Patient left: in chair;with call bell/phone within reach    Functional Assessment Tool Used: clinical judgment Functional Limitation: Mobility: Walking and moving around Mobility: Walking and Moving Around Current Status (T2671): At least 20 percent but less than 40 percent impaired, limited or restricted Mobility: Walking and Moving Around Goal Status [phone]): At least 1 percent but less than 20 percent impaired, limited or restricted    Time: 0850-0913 PT Time Calculation (min): 23 min   Charges:   PT Evaluation $Initial PT Evaluation Tier I: 1 Procedure PT Treatments $Gait Training: 8-22 mins   PT G Codes:   Functional Assessment Tool Used: clinical judgment Functional Limitation: Mobility: Walking and moving around    Travian Kerner 06/30/2013, 9:48 AM Pager [phone]

## 2013-06-30 NOTE — Progress Notes (Signed)
INITIAL NUTRITION ASSESSMENT  DOCUMENTATION CODES Per approved criteria  -Severe malnutrition in the context of chronic illness   INTERVENTION: 1.  Supplements; Ensure Complete po BID, each supplement provides 350 kcal and 13 grams of protein 2.  General healthful diet; encourage intake of foods and beverages as able.  RD to follow and assess for nutritional adequacy. Set intake goal of at least 50% of meals with pt.   NUTRITION DIAGNOSIS: Inadequate oral intake related to poor appetite as evidenced by pt report.    Monitor:  1.  Food/Beverage; pt meeting >/=90% estimated needs with tolerance. 2.  Wt/wt change; monitor trends 3.  Labs; monitor trends.  Reason for Assessment: consult  80 y.o. female  Admitting Dx: Shortness of breath  ASSESSMENT: Pt admitted with shortness of breath and weakness.  Pt states she lives at home.  Pt with PMH of colon CA s/p partial colectomy in 2002.   CT scan in ED showed multiple pumonary nodules concerning for metastatic disease, as well as possible liver and spine mets.   Pt states she has not been eating well at home.  She reports she has continue to eat and prepare meals as able, however she has been forcing herself to eat.  She mostly eats small meals and drinks tea.  She has not tried Ensure but is agreeable.  She is not sure about weight loss.  Per chart review, appears stable.  Reports more weakness at home particularly over the past week.   Discussed nutrition needs and goals.   Nutrition Focused Physical Exam: Subcutaneous Fat:  Orbital Region: mild wasting Upper Arm Region: WNL Thoracic and Lumbar Region: moderate wasting  Muscle:  Temple Region: moderate wasting Clavicle Bone Region: moderate-severe wasting Clavicle and Acromion Bone Region: moderate wasting Scapular Bone Region: moderate wasting Dorsal Hand: moderate-severe wasting Patellar Region: severe wasting Anterior Thigh Region: severe wasting Posterior Calf  Region: severe wasting  Edema: none present  Pt meets criteria for severe MALNUTRITION in the context of chronic as evidenced by moderate subcutaneous wasting, severe muscle wasting, poor PO at home meeting <75% of estimated needs.  Height: Ht Readings from Last 1 Encounters:  06/29/13 5\' 8"  (1.727 m)    Weight: Wt Readings from Last 1 Encounters:  06/30/13 131 lb 1.2 oz (59.456 kg)    Ideal Body Weight: 140 lbs  % Ideal Body Weight: 93%  Wt Readings from Last 10 Encounters:  06/30/13 131 lb 1.2 oz (59.456 kg)  12/13/12 130 lb (58.968 kg)  12/02/11 125 lb 12.8 oz (57.063 kg)  03/02/11 125 lb (56.7 kg)  02/01/11 127 lb (57.607 kg)  01/29/11 127 lb (57.607 kg)  11/25/10 129 lb 12.8 oz (58.877 kg)  09/22/10 137 lb (62.143 kg)  08/25/10 135 lb (61.236 kg)  05/27/10 133 lb (60.328 kg)    Usual Body Weight: 130 lbs  % Usual Body Weight: 100%  BMI:  Body mass index is 19.93 kg/(m^2).  Estimated Nutritional Needs: Kcal: 1400-1600 kcal Protein: 50-65g  Fluid: >1.5 L/day  Skin: no issues noted  Diet Order: Cardiac  EDUCATION NEEDS: -Education needs addressed   Intake/Output Summary (Last 24 hours) at 06/30/13 1309 Last data filed at 06/30/13 1052  Gross per 24 hour  Intake    123 ml  Output    450 ml  Net   -327 ml    Last BM: 3/27  Labs:   Recent Labs Lab 06/29/13 1434 06/30/13 0502  NA 141 139  K 5.2 4.3  CL  105 102  CO2 22 23  BUN 24* 22  CREATININE 1.14* 1.12*  CALCIUM 8.1* 7.8*  GLUCOSE 113* 105*    CBG (last 3)  No results found for this basename: GLUCAP,  in the last 72 hours  Scheduled Meds: . calcium carbonate  1 tablet Oral Q breakfast  . furosemide  20 mg Oral Daily  . levothyroxine  88 mcg Oral QAC breakfast  . metoprolol succinate  25 mg Oral Daily  . multivitamin with minerals  1 tablet Oral Daily  . multivitamin-lutein  1 capsule Oral Daily  . pantoprazole  40 mg Oral Daily  . potassium chloride  10 mEq Oral Daily  .  sodium chloride  3 mL Intravenous Q12H  . warfarin  2 mg Oral ONCE-1800  . Warfarin - Pharmacist Dosing Inpatient   Does not apply q1800    Continuous Infusions: . sodium chloride 20 mL/hr (06/30/13 0247)    Past Medical History  Diagnosis Date  . Macular degeneration   . Peptic ulcer disease     with history of upper GI bleeding in 2004  . Degenerative joint disease     hip and lumbosacral spine  . Adenocarcinoma of colon   . Sick sinus syndrome     Paroxysmal atrial fibrillation with a rapid ventricular response;   . Atrial fibrillation   . Bradycardia, severe sinus     Extreme; junctional bradycardia and sinus arrest up to 4 seconds    Past Surgical History  Procedure Laterality Date  . Cataract extraction    . Cholecystectomy    . Tonsillectomy    . Partial colectomy  2002    Partial colectomy for carcinoma of the colon-2002    Brynda Greathouse, MS RD LDN Clinical Inpatient Dietitian Pager: [phone] Weekend/After hours pager: [phone]

## 2013-06-30 NOTE — Progress Notes (Signed)
[facility] PROGRESS NOTE  ZYAN MIRKIN WUJ:[id number] DOB: 07/31/1915 DOA: 06/29/2013 PCP: Purvis Kilts, MD  Brief narrative: 80 year old female with past medical history of atrial flutter on coumadin, s/p pacemaker, diastolic CHF, hypertension, colon cancer s/p partial colectomy in 2002 who presented to [facility] ED 06/29/2013 with worsening shortness of breath for past 2 weeks prior to this admission. In addition, patient's family reported patient has been feeling weak and has had declined in regards to performing ADL's. In ED, BP was 114/53, HR was 70, Tmax 98.6 F and oxygen saturation was 95% on room air. Chest x-ray showed CABG changes with mild bibasilar effusions, enlarged cardiac silhouette with pulmonary vascular congestion as well as a questionable right upper lobe density. Followup CT angio chest was negative for PE. There were multiple pulmonary nodules concerning for metastatic disease as well as liver nodules and T3, T4 vertebral lesions concerning for metastatic disease. Pro BNP at 2300, patient family refused for patient to have IV lasix on admission.  Assessment/Plan:  Principal Problem:   Shortness of breath - possibly related to acute diastolic CHF considering BNP elevated at 2300; pt is on low dose PO lasix per family request, they have refused IV lasix. Cardiac enzymes x 1 set WNL. 2 D ECHO is pending.  - respiratory status is stable - she does have possible pulmonary mets as evident on CT chest; family is not at the bedside this am to discuss this finding with them; due to multiple co-morbidities and advanced age pt likely not a candidate for chemotherapy Active Problems:   HYPERTENSION - continue metoprolol 25 mg daily   Atrial flutter, fibrillation  - anticoagulation with coumadin - rate controlled with metoprolol 25 mg daily - INR at therapeutic level; no bleeding   Cardiac pacemaker in situ - stable   Chronic diastolic heart failure - BNP 2300 on  admission; pt refused for the pt lasix IV and currently pt is only on los dose lasix 20 mg daily - check 2 D ECHO - replace electrolytes as needed   Acute renal failure - likely due to dehydration - also on low dose lasix - creatinine is 1.14; continue to monitor renal function   Anemia of chronic disease - secondary to history of colon cancer - hemoglobin WNL on admission   Leukocytosis - no evidence of pneumonia or UTI - likely reactive from metastatic disease   Colon cancer metastasized to multiple sites - as mentioned will talk to patient's family and discuss the findings of metastatic disease   Moderate protein-calorie malnutrition - nutrition consulted   H/O PUD - continue PPI therapy   Code Status: DNR/DNI Family Communication: no family at the bedside Disposition Plan: PT recommended SNF  Leisa Lenz, MD  [facility] Pager [phone]  If 7PM-7AM, please contact night-coverage [email or web address] Password [email or web address] 06/30/2013, 11:06 AM   LOS: 1 day   Consultants:  None   Procedures:  None   Antibiotics:  None  HPI/Subjective: No overnight events.  Objective: Filed Vitals:   06/29/13 2200 06/29/13 2245 06/30/13 0509 06/30/13 1021  BP: 146/67 137/67 114/53 108/62  Pulse: 81 73 84 76  Temp: 97.7 F (36.5 C) 97.7 F (36.5 C) 97.9 F (36.6 C) 98.2 F (36.8 C)  TempSrc: Oral Oral Oral Oral  Resp: 30 18 20 18   Height:  5\' 8"  (1.727 m)    Weight:  59.557 kg (131 lb 4.8 oz) 59.456 kg (131 lb 1.2 oz)   SpO2: 98% 98% 95%  96%    Intake/Output Summary (Last 24 hours) at 06/30/13 1106 Last data filed at 06/30/13 1052  Gross per 24 hour  Intake    123 ml  Output    450 ml  Net   -327 ml    Exam:   General:  Pt is not in acute distress  Cardiovascular: paced rhythm, S1/S2 appreciated  Respiratory: Clear to auscultation bilaterally, no wheezing  Abdomen: Soft, non tender, non distended, bowel sounds present, no guarding  Extremities: trace edema,  pulses DP and PT palpable bilaterally  Neuro: Grossly nonfocal  Data Reviewed: Basic Metabolic Panel:  Recent Labs Lab 06/29/13 1434 06/30/13 0502  NA 141 139  K 5.2 4.3  CL 105 102  CO2 22 23  GLUCOSE 113* 105*  BUN 24* 22  CREATININE 1.14* 1.12*  CALCIUM 8.1* 7.8*   Liver Function Tests:  Recent Labs Lab 06/30/13 0502  AST 24  ALT 13  ALKPHOS 170*  BILITOT 0.6  PROT 5.5*  ALBUMIN 2.5*   No results found for this basename: LIPASE, AMYLASE,  in the last 168 hours No results found for this basename: AMMONIA,  in the last 168 hours CBC:  Recent Labs Lab 06/29/13 1434 06/30/13 0502  WBC 16.9* 13.2*  NEUTROABS  --  10.9*  HGB 12.2 11.2*  HCT 38.4 34.7*  MCV 84.0 82.4  PLT 273 241   Cardiac Enzymes:  Recent Labs Lab 06/29/13 2147 06/30/13 0335 06/30/13 0925  TROPONINI <0.30 <0.30 <0.30   BNP: No components found with this basename: POCBNP,  CBG: No results found for this basename: GLUCAP,  in the last 168 hours  No results found for this or any previous visit (from the past 240 hour(s)).   Studies: Dg Chest 2 View 06/29/2013    IMPRESSION: COPD changes with bibasilar effusions and atelectasis.  Enlargement of cardiac silhouette with pulmonary vascular congestion post pacemaker.  9 mm questionable right upper lobe nodular density.  New sclerotic upper thoracic vertebral body approximately T5 highly suspicious for osseous metastatic disease.  CT chest recommended to exclude pulmonary nodule/metastasis.   Electronically Signed   By: Lavonia Dana M.D.   On: 06/29/2013 15:09   Ct Angio Chest Pe W/cm &/or Wo Cm 06/29/2013    IMPRESSION: 1. No evidence of a pulmonary embolus. 2. Findings are highly suspicious for neoplastic/metastatic disease. There are multiple pulmonary nodules. There is abnormal hypoattenuation throughout much of the liver and there is evidence of peritoneal carcinomatosis. There is also a sclerotic T4 vertebrae with some small areas of  sclerosis in the T3 vertebrae. Possibilities include metastatic disease from breast carcinoma. Hepatocellular carcinoma may be the primary, given the underlying changes of cirrhosis. 3. There is also cardiomegaly and small, right greater than left, pleural effusions. Cardiac decompensation may be responsible for the shortness of breath.   Electronically Signed   By: Lajean Manes M.D.   On: 06/29/2013 17:24    Scheduled Meds: . calcium carbonate  1 tablet Oral Q breakfast  . furosemide  20 mg Oral Daily  . levothyroxine  88 mcg Oral QAC breakfast  . metoprolol succinate  25 mg Oral Daily  . multivitamin with mine  1 tablet Oral Daily  . multivitamin-lutein  1 capsule Oral Daily  . pantoprazole  40 mg Oral Daily  . potassium chloride  10 mEq Oral Daily  . Warfarin -   Does not apply q1800   Continuous Infusions: . sodium chloride 20 mL/hr (06/30/13 0247)

## 2013-07-01 DIAGNOSIS — D638 Anemia in other chronic diseases classified elsewhere: Secondary | ICD-10-CM

## 2013-07-01 DIAGNOSIS — E43 Unspecified severe protein-calorie malnutrition: Secondary | ICD-10-CM | POA: Diagnosis present

## 2013-07-01 DIAGNOSIS — I369 Nonrheumatic tricuspid valve disorder, unspecified: Secondary | ICD-10-CM

## 2013-07-01 DIAGNOSIS — Z950 Presence of cardiac pacemaker: Secondary | ICD-10-CM

## 2013-07-01 DIAGNOSIS — N179 Acute kidney failure, unspecified: Secondary | ICD-10-CM

## 2013-07-01 LAB — CBC WITH DIFFERENTIAL/PLATELET
Basophils Absolute: 0 10*3/uL (ref 0.0–0.1)
Basophils Relative: 0 % (ref 0–1)
Eosinophils Absolute: 0.3 10*3/uL (ref 0.0–0.7)
Eosinophils Relative: 2 % (ref 0–5)
HCT: 34.7 % — ABNORMAL LOW (ref 36.0–46.0)
HEMOGLOBIN: 11.3 g/dL — AB (ref 12.0–15.0)
LYMPHS ABS: 0.9 10*3/uL (ref 0.7–4.0)
Lymphocytes Relative: 7 % — ABNORMAL LOW (ref 12–46)
MCH: 27 pg (ref 26.0–34.0)
MCHC: 32.6 g/dL (ref 30.0–36.0)
MCV: 83 fL (ref 78.0–100.0)
MONOS PCT: 9 % (ref 3–12)
Monocytes Absolute: 1.1 10*3/uL — ABNORMAL HIGH (ref 0.1–1.0)
NEUTROS ABS: 9.7 10*3/uL — AB (ref 1.7–7.7)
Neutrophils Relative %: 82 % — ABNORMAL HIGH (ref 43–77)
Platelets: 247 10*3/uL (ref 150–400)
RBC: 4.18 MIL/uL (ref 3.87–5.11)
RDW: 15.4 % (ref 11.5–15.5)
WBC: 11.9 10*3/uL — ABNORMAL HIGH (ref 4.0–10.5)

## 2013-07-01 LAB — COMPREHENSIVE METABOLIC PANEL
ALK PHOS: 180 U/L — AB (ref 39–117)
ALT: 13 U/L (ref 0–35)
AST: 23 U/L (ref 0–37)
Albumin: 2.5 g/dL — ABNORMAL LOW (ref 3.5–5.2)
BILIRUBIN TOTAL: 0.4 mg/dL (ref 0.3–1.2)
BUN: 28 mg/dL — AB (ref 6–23)
CO2: 24 mEq/L (ref 19–32)
CREATININE: 1.24 mg/dL — AB (ref 0.50–1.10)
Calcium: 7.9 mg/dL — ABNORMAL LOW (ref 8.4–10.5)
Chloride: 103 mEq/L (ref 96–112)
GFR, EST AFRICAN AMERICAN: 41 mL/min — AB (ref >90)
GFR, EST NON AFRICAN AMERICAN: 36 mL/min — AB (ref >90)
GLUCOSE: 107 mg/dL — AB (ref 70–99)
Potassium: 4.4 mEq/L (ref 3.7–5.3)
Sodium: 140 mEq/L (ref 137–147)
Total Protein: 5.6 g/dL — ABNORMAL LOW (ref 6.0–8.3)

## 2013-07-01 LAB — PROTIME-INR
INR: 2.53 — AB (ref 0.00–1.49)
Prothrombin Time: 26.4 seconds — ABNORMAL HIGH (ref 11.6–15.2)

## 2013-07-01 MED ORDER — WARFARIN SODIUM 1 MG PO TABS
1.0000 mg | ORAL_TABLET | Freq: Once | ORAL | Status: AC
  Administered 2013-07-01: 1 mg via ORAL
  Filled 2013-07-01: qty 1

## 2013-07-01 NOTE — Progress Notes (Signed)
ANTICOAGULATION CONSULT NOTE - Follow Up Consult  Pharmacy Consult for Coumadin Indication: atrial fibrillation  No Known Allergies  Patient Measurements: Height: 5\' 8"  (172.7 cm) Weight: 131 lb 0.8 oz (59.445 kg) IBW/kg (Calculated) : 63.9  Vital Signs: Temp: 97.7 F (36.5 C) (03/29 0514) Temp src: Oral (03/29 0514) BP: 124/69 mmHg (03/29 0514) Pulse Rate: 85 (03/29 0514)  Labs:  Recent Labs  06/29/13 1434 06/29/13 2147 06/30/13 0335 06/30/13 0502 06/30/13 0925 07/01/13 0420  HGB 12.2  --   --  11.2*  --  11.3*  HCT 38.4  --   --  34.7*  --  34.7*  PLT 273  --   --  241  --  247  LABPROT 24.1*  --   --  23.1*  --  26.4*  INR 2.24*  --   --  2.12*  --  2.53*  CREATININE 1.14*  --   --  1.12*  --  1.24*  TROPONINI  --  <0.30 <0.30  --  <0.30  --     Estimated Creatinine Clearance: 25.4 ml/min (by C-G formula based on Cr of 1.24).   Assessment: 80 y/o female on chronic Coumadin for Afib. INR is therapeutic this morning. No bleeding noted, CBC stable.  Coumadin doses held on 3/25 and 3/26 for supratherapeutic INR. New home Coumadin dose is 1 mg daily except 2 mg on Tuesday and Saturday. Had been on 2 mg daily except 1 mg on Tuesday and Friday prior to 3/25.  Goal of Therapy:  INR 2-3 Monitor platelets by anticoagulation protocol: Yes   Plan:  - Coumadin 1 mg PO tonight - INR daily - Monitor for s/sx of bleeding  [facility], Pharm.D., BCPS Clinical Pharmacist Pager: [phone] 07/01/2013 9:00 AM

## 2013-07-01 NOTE — Progress Notes (Signed)
Echocardiogram 2D Echocardiogram has been performed.  Wendy Garner 07/01/2013, 11:31 AM

## 2013-07-01 NOTE — Progress Notes (Signed)
06/30/13-1259-J.Amarria Andreasen,RN,BSN      Called CSW at [phone] to speak to [place] regarding potential placement in SNF while still maintaining OBS status. Left message with contact information. Awaiting return call.

## 2013-07-01 NOTE — Clinical Social Work Psychosocial (Signed)
Clinical Social Work Department BRIEF PSYCHOSOCIAL ASSESSMENT 07/01/2013  Patient:  Wendy Garner, Wendy Garner     Account Number:  [id number]     Admit date:  06/29/2013  Clinical Social Worker:  Lovey Newcomer  Date/Time:  07/01/2013 03:00 PM  Referred by:  Physician  Date Referred:  07/01/2013 Referred for  SNF Placement   Other Referral:   Interview type:  Patient Other interview type:   Patient and family (daughter and son) interviewed at bedside.    PSYCHOSOCIAL DATA Living Status:  ALONE Admitted from facility:   Level of care:   Primary support name:  Lavell Luster and Katrina Primary support relationship to patient:  CHILD, ADULT Degree of support available:   Support is strong. But family cannot manage patient's needs at home.    CURRENT CONCERNS Current Concerns  Post-Acute Placement   Other Concerns:   CSW received call from [facility] completing UR on patient. RNCM completing UR stated that patient is a two day observation patient and that the patient needs to discharge today, either home or to SNF.    SOCIAL WORK ASSESSMENT / PLAN CSW met with patient and family at bedside along with weekend RNCM to complete assessment and discuss situation. Patient is a two day observation patient with Medicare insurance. PT has recommended SNF for patient and patient and children expect patient to be discharged to SNF.    Patient states that she is from home alone. Patient's has two children at bedside ([place]). Patient states that she plans to DC to a SNF (preference is [facility]). CSW educated patient and family on Medicare SNF benefits and how a patient MUST have a qualifying 3 night inpatient stay in order to use these SNF benefits. RNCM assisted CSW in explaining that patient is medically stable for discharge and that her insurance likely not pay for an additional night in the hospital. Patient's son Lavell Luster states that the MD has scheduled a meeting with  family in the morning at 9:30AM to discuss a plan of care. CSW and RNCM reiterated that unless patient has a 3 night qualifying inpatient stay that is medically necessary, the patient will not be able to use SNF benefits and the family will have to pay for SNF care privately. The family cannot pay for SNF privately. RNCM and CSW have both explained situation to MD.    Per family patient has been diagnosed with cancer and are hoping that patient will be switched to impatient in the event more medical intervention is performed. Patient's family was frustrated by the news delivered by [facility] and CSW but were very appreciative of the fact that CSW and RNCM informed them of what could possibly happen if Medicare does not cover the additional night stay in the hospital.    CSW explained SNF search/placement process to patient and family and provided list of SNFs. CSW reiterated that patient would have to pay privately unless she has a qualifying 3 night inpatient stay that is medically necessary. Patient and family verbalized understanding.   Assessment/plan status:  Psychosocial Support/Ongoing Assessment of Needs Other assessment/ plan:   Complete FL2, Fax, PASRR   Information/referral to community resources:   CSW educated patient and family on Medicare SNF benefits and provided SNF list.    PATIENT'S/FAMILY'S RESPONSE TO PLAN OF CARE: Patient and family plan to DC to SNF as they do not feel patient can return home alone. Patient and family were frustrated by the news delivered by CSW and RNCM.  CSW will continue to follow for CSW related DC needs.       Liz Beach, [place], [place], [phone]

## 2013-07-01 NOTE — Progress Notes (Signed)
   CARE MANAGEMENT NOTE 07/01/2013  Patient:  Wendy Garner, Wendy Garner   Account Number:  [id number]  Date Initiated:  07/01/2013  Documentation initiated by:  [facility]  Subjective/Objective Assessment:   adm: worsening shortness of breath     Action/Plan:   discharge planning   Anticipated DC Date:  07/01/2013   Anticipated DC Plan:           Choice offered to / List presented to:             Status of service:  In process, will continue to follow Medicare Important Message given?   (If response is "NO", the following Medicare IM given date fields will be blank) Date Medicare IM given:   Date Additional Medicare IM given:    Discharge Disposition:    Per UR Regulation:    If discussed at Long Length of Stay Meetings, dates discussed:    Comments:  07/01/13 16 :30 CM received call from [facility] as pt does not qualify for SNF (2 days OBS) and is medically stable for discharge.  CM and CSW went to room but family had just left.  Pt requested CM call Katrina.  CM called Katrina and I explained her mother was medically stable for discharge and if family wanted to continue to seek placement for pt, they could pursue this with a HHSW.  Katrina and brother Deborah Chalk came back to hospital to discuss disposition.  Family verbalized understanding Medicare will not pay for SNF at this time bc pt does not fill INPATIENTx3 criteria.  Family also verbalized understanding Medicare would not likely cover for this night's hospital stay, however, they state they cannot take care of their mother at home.  CSW and CM had placed several calls to MD and while in room, MD called back and spoke with [facility] on phone and to CM.  Md has scheduled meeting with family in the am to discuss goals of care.  CM will continue to follow for [facility] needs.  Mariane Masters, [place], [place].

## 2013-07-01 NOTE — Progress Notes (Signed)
07/02/11-1330-J.Kamie Korber,RN,BSN       Spoke with Aaron Edelman, [place] regarding continued observation status and PT recommendation for SNF placement. CSW to talk with patient/ family regarding self-pay for SNF vs discharge home.

## 2013-07-01 NOTE — Progress Notes (Signed)
[facility] PROGRESS NOTE  Wendy Garner FTD:[id number] DOB: September 18, 1915 DOA: 06/29/2013 PCP: Purvis Kilts, MD  Brief narrative: 80 year old female with past medical history of atrial flutter on coumadin, s/p pacemaker, diastolic CHF, hypertension, colon cancer s/p partial colectomy in 2002 who presented to [facility] ED 06/29/2013 with worsening shortness of breath for past 2 weeks prior to this admission. In addition, patient's family reported patient has been feeling weak and has had declined in regards to performing ADL's.  In ED, BP was 114/53, HR was 70, Tmax 98.6 F and oxygen saturation was 95% on room air. Chest x-ray showed CABG changes with mild bibasilar effusions, enlarged cardiac silhouette with pulmonary vascular congestion as well as a questionable right upper lobe density. BNP was elevated at 2300. Followup CT angio chest was negative for PE. There were multiple pulmonary nodules concerning for metastatic disease as well as liver nodules and T3, T4 vertebral lesions concerning for metastatic disease.   Assessment/Plan:   Principal Problem:  Shortness of breath  - possibly related to acute diastolic CHF considering BNP elevated at 2300; we will continue low dose PO lasix per family request, they have refused IV lasix.  Patient had 450 cc urine output in past 12 hours. Cardiac enzymes x 1 set WNL. 2 D ECHO is pending.  - respiratory status is stable this am - she does have possible pulmonary mets as evident on CT chest; I have reviewed the findings with the patient and have explained to her that CT scan is suggestive of metastatic disease. She said she will speak with her family about the findings and will see what the plan would be  Active Problems:  HYPERTENSION  - continue metoprolol 25 mg daily  - BP 124/69 this am Atrial flutter, fibrillation  - anticoagulation with coumadin  - rate controlled with metoprolol 25 mg daily  - INR at therapeutic level; no bleeding   Cardiac pacemaker in situ  - stable  Chronic diastolic heart failure  - BNP 2300 on admission; pt refused for the pt lasix IV and currently pt is only on los dose lasix 20 mg daily  - 2 D ECHO pending  - replace electrolytes as needed  Acute renal failure  - likely due to dehydration  - also on low dose lasix which could be worsening renal function; creatinine is 1.2 this am (up from admission value of 1.14) Anemia of chronic disease  - secondary to history of colon cancer  - hemoglobin WNL on admission  Leukocytosis  - no evidence of pneumonia or UTI - likely reactive from metastatic disease  Colon cancer metastasized to multiple sites  - as mentioned pt will talk to her family and discuss the findings of metastatic disease  Moderate protein-calorie malnutrition  - nutrition consulted  - continue multivitamins  H/O PUD  - continue PPI therapy   Code Status: DNR/DNI  Family Communication: no family at the bedside  Disposition Plan: PT recommended SNF; pt agreeable to go to SNF  Consultants:  None  Procedures:  None  Antibiotics:  None   Leisa Lenz, MD  [facility] Pager [phone]  If 7PM-7AM, please contact night-coverage [email or web address] Password [email or web address] 07/01/2013, 7:09 AM   LOS: 2 days    HPI/Subjective: Pt says she feels short of breath with exertion. She is sitting in a chair this am eating breakfast.   Objective: Filed Vitals:   06/30/13 1021 06/30/13 1529 06/30/13 2107 07/01/13 0514  BP: 108/62 105/44 132/64 124/69  Pulse: 76 72 81 85  Temp: 98.2 F (36.8 C) 98.2 F (36.8 C) 97.9 F (36.6 C) 97.7 F (36.5 C)  TempSrc: Oral Oral Oral Oral  Resp: 18 18 20 20   Height:      Weight:    59.445 kg (131 lb 0.8 oz)  SpO2: 96% 96% 100% 98%    Intake/Output Summary (Last 24 hours) at 07/01/13 0709 Last data filed at 07/01/13 0500  Gross per 24 hour  Intake    363 ml  Output    250 ml  Net    113 ml    Exam:   General:  Pt is alert, follows  commands appropriately, not in acute distress  Cardiovascular: Regular rate and rhythm, S1/S2,appreciated   Respiratory: Clear to auscultation bilaterally, no wheezing, no crackles, no rhonchi  Abdomen: Soft, non tender, non distended, bowel sounds present, no guarding  Extremities: No edema, pulses DP and PT palpable bilaterally  Neuro: Grossly nonfocal  Data Reviewed: Basic Metabolic Panel:  Recent Labs Lab 06/29/13 1434 06/30/13 0502 07/01/13 0420  NA 141 139 140  K 5.2 4.3 4.4  CL 105 102 103  CO2 22 23 24   GLUCOSE 113* 105* 107*  BUN 24* 22 28*  CREATININE 1.14* 1.12* 1.24*  CALCIUM 8.1* 7.8* 7.9*   Liver Function Tests:  Recent Labs Lab 06/30/13 0502 07/01/13 0420  AST 24 23  ALT 13 13  ALKPHOS 170* 180*  BILITOT 0.6 0.4  PROT 5.5* 5.6*  ALBUMIN 2.5* 2.5*   No results found for this basename: LIPASE, AMYLASE,  in the last 168 hours No results found for this basename: AMMONIA,  in the last 168 hours CBC:  Recent Labs Lab 06/29/13 1434 06/30/13 0502 07/01/13 0420  WBC 16.9* 13.2* 11.9*  NEUTROABS  --  10.9* 9.7*  HGB 12.2 11.2* 11.3*  HCT 38.4 34.7* 34.7*  MCV 84.0 82.4 83.0  PLT 273 241 247   Cardiac Enzymes:  Recent Labs Lab 06/29/13 2147 06/30/13 0335 06/30/13 0925  TROPONINI <0.30 <0.30 <0.30   BNP: No components found with this basename: POCBNP,  CBG: No results found for this basename: GLUCAP,  in the last 168 hours  No results found for this or any previous visit (from the past 240 hour(s)).   Studies: Dg Chest 2 View 06/29/2013  IMPRESSION: COPD changes with bibasilar effusions and atelectasis.  Enlargement of cardiac silhouette with pulmonary vascular congestion post pacemaker.  9 mm questionable right upper lobe nodular density.  New sclerotic upper thoracic vertebral body approximately T5 highly suspicious for osseous metastatic disease.  CT chest recommended to exclude pulmonary nodule/metastasis.    Ct Angio Chest Pe W/cm  &/or Wo Cm 06/29/2013    IMPRESSION: 1. No evidence of a pulmonary embolus. 2. Findings are highly suspicious for neoplastic/metastatic disease. There are multiple pulmonary nodules. There is abnormal hypoattenuation throughout much of the liver and there is evidence of peritoneal carcinomatosis. There is also a sclerotic T4 vertebrae with some small areas of sclerosis in the T3 vertebrae. Possibilities include metastatic disease from breast carcinoma. Hepatocellular carcinoma may be the primary, given the underlying changes of cirrhosis. 3. There is also cardiomegaly and small, right greater than left, pleural effusions. Cardiac decompensation may be responsible for the shortness of breath.     Scheduled Meds: . calcium carbonate  1 tablet Oral Q breakfast  . feeding supplement  237 mL Oral BID BM  . furosemide  20 mg Oral Daily  . levothyroxine  88  mcg Oral QAC breakfast  . metoprolol succinate  25 mg Oral Daily  . multivitamin with  1 tablet Oral Daily  . multivitamin-lutein  1 capsule Oral Daily  . pantoprazole  40 mg Oral Daily  . potassium chloride  10 mEq Oral Daily  . Warfarin    Does not apply q1800

## 2013-07-01 NOTE — Progress Notes (Signed)
07/01/13-1421-J.Laquisha Northcraft,RN,BSN      Attempted to call Dr. Charlies Silvers per Aaron Edelman CSW's request to discuss why patient does not meet for inpatient status. No answer. Message left with contact information and request for Dr. To return call.

## 2013-07-01 NOTE — Progress Notes (Signed)
Utilization review completed.  P.J. Rhonin Trott,RN,BSN Case Manager 

## 2013-07-01 NOTE — Clinical Social Work Note (Signed)
CSW received call from CM completing UR stating that patient is two day observation and "needs to go to SNF and pay privately, or go home." Patient is a two day observation patient and has traditional Medicare and has not had qualifying 3 night stay, so Medicare will not pay for SNF placement. CSW first contacted MD, MD wanted to know why patient is not inpatient, CSW explained to MD that CSW does not make this determination. MD stated that she needed to figure things about before discharging patient.   Liz Beach, [place], [place], [phone]

## 2013-07-02 DIAGNOSIS — I509 Heart failure, unspecified: Secondary | ICD-10-CM | POA: Diagnosis present

## 2013-07-02 LAB — COMPREHENSIVE METABOLIC PANEL
ALBUMIN: 2.4 g/dL — AB (ref 3.5–5.2)
ALT: 13 U/L (ref 0–35)
AST: 24 U/L (ref 0–37)
Alkaline Phosphatase: 190 U/L — ABNORMAL HIGH (ref 39–117)
BILIRUBIN TOTAL: 0.5 mg/dL (ref 0.3–1.2)
BUN: 26 mg/dL — AB (ref 6–23)
CHLORIDE: 103 meq/L (ref 96–112)
CO2: 24 mEq/L (ref 19–32)
CREATININE: 1.08 mg/dL (ref 0.50–1.10)
Calcium: 7.7 mg/dL — ABNORMAL LOW (ref 8.4–10.5)
GFR calc Af Amer: 49 mL/min — ABNORMAL LOW (ref >90)
GFR calc non Af Amer: 42 mL/min — ABNORMAL LOW (ref >90)
Glucose, Bld: 110 mg/dL — ABNORMAL HIGH (ref 70–99)
POTASSIUM: 4.5 meq/L (ref 3.7–5.3)
Sodium: 140 mEq/L (ref 137–147)
Total Protein: 5.4 g/dL — ABNORMAL LOW (ref 6.0–8.3)

## 2013-07-02 LAB — CBC WITH DIFFERENTIAL/PLATELET
BASOS ABS: 0 10*3/uL (ref 0.0–0.1)
Basophils Relative: 0 % (ref 0–1)
Eosinophils Absolute: 0.3 10*3/uL (ref 0.0–0.7)
Eosinophils Relative: 3 % (ref 0–5)
HCT: 35.5 % — ABNORMAL LOW (ref 36.0–46.0)
Hemoglobin: 11.4 g/dL — ABNORMAL LOW (ref 12.0–15.0)
Lymphocytes Relative: 8 % — ABNORMAL LOW (ref 12–46)
Lymphs Abs: 0.9 10*3/uL (ref 0.7–4.0)
MCH: 26.7 pg (ref 26.0–34.0)
MCHC: 32.1 g/dL (ref 30.0–36.0)
MCV: 83.1 fL (ref 78.0–100.0)
Monocytes Absolute: 0.9 10*3/uL (ref 0.1–1.0)
Monocytes Relative: 8 % (ref 3–12)
NEUTROS ABS: 9.6 10*3/uL — AB (ref 1.7–7.7)
NEUTROS PCT: 82 % — AB (ref 43–77)
Platelets: 229 10*3/uL (ref 150–400)
RBC: 4.27 MIL/uL (ref 3.87–5.11)
RDW: 15.4 % (ref 11.5–15.5)
WBC: 11.7 10*3/uL — ABNORMAL HIGH (ref 4.0–10.5)

## 2013-07-02 LAB — PROTIME-INR
INR: 2.99 — AB (ref 0.00–1.49)
PROTHROMBIN TIME: 30 s — AB (ref 11.6–15.2)

## 2013-07-02 LAB — VITAMIN D 25 HYDROXY (VIT D DEFICIENCY, FRACTURES): Vit D, 25-Hydroxy: 55 ng/mL (ref 30–89)

## 2013-07-02 MED ORDER — WARFARIN 0.5 MG HALF TABLET
0.5000 mg | ORAL_TABLET | Freq: Once | ORAL | Status: AC
  Administered 2013-07-02: 0.5 mg via ORAL
  Filled 2013-07-02: qty 1

## 2013-07-02 MED ORDER — FUROSEMIDE 10 MG/ML IJ SOLN
20.0000 mg | Freq: Two times a day (BID) | INTRAMUSCULAR | Status: DC
  Administered 2013-07-02 – 2013-07-03 (×3): 20 mg via INTRAVENOUS
  Filled 2013-07-02 (×4): qty 2

## 2013-07-02 NOTE — Clinical Social Work Placement (Addendum)
Clinical Social Work Department  CLINICAL SOCIAL WORK PLACEMENT NOTE   Patient: Wendy Garner  Account Number: [id number] Admit date: 06/29/13 Clinical Social Worker: Rhea Pink LCSWA Date/time: 07/02/2013 11:30 AM  Clinical Social Work is seeking post-discharge placement for this patient at the following level of care: SKILLED NURSING (*CSW will update this form in Epic as items are completed)  07/02/2013 Patient/family provided with [place] Department of Clinical Social Work's list of facilities offering this level of care within the geographic area requested by the patient (or if unable, by the patient's family).  07/02/2013 Patient/family informed of their freedom to choose among providers that offer the needed level of care, that participate in Medicare, Medicaid or managed care program needed by the patient, have an available bed and are willing to accept the patient.  Patient/family informed of [facility]' ownership interest in [facility], as well as of the fact that they are under no obligation to receive care at this facility.  PASARR submitted to EDS on Pre-existing PASARR number received from EDS on  FL2 transmitted to all facilities in geographic area requested by pt/family on 07/02/2013 FL2 transmitted to all facilities within larger geographic area on  Patient informed that his/her managed care company has contracts with or will negotiate with certain facilities, including the following:  Patient/family informed of bed offers received: 07/03/13 Patient chooses bed at [facility] Physician recommends and patient chooses bed at  Patient to be transferred to on 07/05/2013 Patient to be transferred to facility by [facility] The following physician request were entered in Epic:  Additional Comments:

## 2013-07-02 NOTE — Clinical Social Work Psychosocial (Addendum)
Clinical Social Work Department  BRIEF PSYCHOSOCIAL ASSESSMENT  Patient: Wendy Garner  Account Number: [id number]   Admit date: 06/29/13 Clinical Social Worker Rhea Pink, MSW Date/Time: 07/02/2013 Referred by: Physician Date Referred: NA Referred for   SNF Placement   Other Referral:  Interview type: Patient's son and family- Patient was asleep and according to family is not oriented to place or time  Other interview type: PSYCHOSOCIAL DATA  Living Status: Family Admitted from facility:  Level of care:  Primary support name: Wendy Garner Primary support relationship to patient: Son Degree of support available:  Strong and vested  CURRENT CONCERNS  Current Concerns   Post-Acute Placement   Other Concerns:  SOCIAL WORK ASSESSMENT / PLAN  CSW met with pt and patient's family re: PT recommendation for SNF. Patient in now in-patient  Pt lives with family  CSW explained placement process and answered questions. Patient's son asked questions about financial responsibilities and CSW spent time explaining what would be covered. Family was appreciative of the information. They were also relieved to find out that 20 days would be covered by Medicare.    Pt reports [facility], [employer] and and [facility] as their preference    CSW completed FL2 and initiated SNF search.     Assessment/plan status: Information/Referral to [employer]  Other assessment/ plan:  Information/referral to community resources:  SNF   PTAR  PATIENT'S/FAMILY'S RESPONSE TO PLAN OF CARE:  Pt's family was jovial and very supportive. Family were all in agreement that patient will nee ST-SNF. Patient was very sleep during the conversation and CSW didn't want to wake the patient because the family feels she hasn't been sleeping well. CSW will continue to follow and will assist with all d/c needs  Rhea Pink, MSW, [place]

## 2013-07-02 NOTE — Progress Notes (Signed)
Thank you for consulting the Palliative Medicine Team at [facility] to meet your patient's and family's needs.   The reason that you asked [place] to see your patient is for [place] and options.   We have scheduled your patient for a meeting: 3/31 1300  The Surrogate decision make is: Katrina and Psychologist, occupational information: facesheet   Your patient is able/unable to participate: able  Vinie Sill, NP Palliative Medicine Team Pager # [phone] (M-F 8a-5p) Team Phone # [phone] (Nights/Weekends)

## 2013-07-02 NOTE — Progress Notes (Signed)
[facility] PROGRESS NOTE  Wendy Garner:[id number] DOB: 01-25-1916 DOA: 06/29/2013 PCP: Purvis Kilts, MD  Brief narrative: 80 year old female with past medical history of atrial flutter on coumadin, s/p pacemaker, diastolic CHF, hypertension, colon cancer s/p partial colectomy in 2002 who presented to [facility] ED 06/29/2013 with worsening shortness of breath for past 2 weeks prior to this admission. In addition, patient's family reported patient has been feeling weak and has had declined in regards to performing ADL's.   In ED, BP was 114/53, HR was 70, Tmax 98.6 F and oxygen saturation was 95% on room air. Chest x-ray showed CABG changes with mild bibasilar effusions, enlarged cardiac silhouette with pulmonary vascular congestion as well as a questionable right upper lobe density. BNP was elevated at 2300. Followup CT angio chest was negative for PE. There were multiple pulmonary nodules concerning for metastatic disease as well as liver nodules and T3, T4 vertebral lesions concerning for metastatic disease.   Assessment/Plan:   Principal Problem:  Shortness of breath  - Possibly related to acute diastolic CHF considering BNP elevated at 2300; patient was on low dose lasix, 20 mg PO daily and her creatinine was slightly above normal range but this am she is more short of breath and she is in bed. - We will change lasix to 20 mg IV BID and see if her shortness of breath improves. Check BNP in am. - In past 24 hours patient is +800 cc fluid balance. Hopefully she will get better diureses with IV lasix - Cardiac enzymes x 1 set WNL. 2-D ECHO shows EF 55% - She does have possible pulmonary mets as evident on CT chest; I have reviewed the findings with the patient and the patient's family have explained to her that CT scan is suggestive of metastatic disease. She said she will speak with her family about the findings and will see what the plan would be   Active Problems:   HYPERTENSION  - continue metoprolol 25 mg daily  Atrial flutter, fibrillation  - anticoagulation with coumadin  - rate controlled with metoprolol 25 mg daily  - INR at therapeutic level; no bleeding  Cardiac pacemaker in situ  - stable  Chronic diastolic heart failure  - BNP 2300 on admission; pt is more short of breath this am; will start lasix 20 mg IV BID - strict intake and output - replace electrolytes as needed  Acute renal failure  - likely due to dehydration  - her renal function is better this am, WNL Anemia of chronic disease  - secondary to history of colon cancer  - hemoglobin WNL on admission  Leukocytosis  - no evidence of pneumonia or UTI - likely reactive from metastatic disease  Colon cancer metastasized to multiple sites  - palliative care consult for goals of care  Moderate protein-calorie malnutrition  - nutrition consulted  - continue multivitamins  H/O PUD  - continue PPI therapy   Code Status: DNR/DNI  Family Communication: no family at the bedside  Disposition Plan: PT recommended SNF; pt agreeable to go to SNF   Consultants:  PCT Procedures:  None  Antibiotics:  None   Leisa Lenz, MD  [facility] Pager [phone]  If 7PM-7AM, please contact night-coverage [email or web address] Password [email or web address] 07/02/2013, 10:13 AM   LOS: 3 days    HPI/Subjective: No events overnight.   Objective: Filed Vitals:   07/01/13 1012 07/01/13 1622 07/01/13 2100 07/02/13 0543  BP: 96/56 108/55 119/44 115/60  Pulse: 77  62 74 74  Temp:  97.7 F (36.5 C) 98.2 F (36.8 C) 97.9 F (36.6 C)  TempSrc:  Oral Oral Oral  Resp:  18 18 18   Height:      Weight:    59.739 kg (131 lb 11.2 oz)  SpO2:  96% 96% 98%    Intake/Output Summary (Last 24 hours) at 07/02/13 1013 Last data filed at 07/02/13 0844  Gross per 24 hour  Intake    840 ml  Output    601 ml  Net    239 ml    Exam:   General:  Pt is alert, follows commands appropriately, not in acute  distress  Cardiovascular: Regular rate and rhythm, S1/S2, no murmurs, no rubs, no gallops  Respiratory: diminished breath sounds, bibasilar crackles   Abdomen: Soft, non tender, non distended, bowel sounds present, no guarding  Extremities: No edema, pulses DP and PT palpable bilaterally  Neuro: Grossly nonfocal  Data Reviewed: Basic Metabolic Panel:  Recent Labs Lab 06/29/13 1434 06/30/13 0502 07/01/13 0420 07/02/13 0546  NA 141 139 140 140  K 5.2 4.3 4.4 4.5  CL 105 102 103 103  CO2 22 23 24 24   GLUCOSE 113* 105* 107* 110*  BUN 24* 22 28* 26*  CREATININE 1.14* 1.12* 1.24* 1.08  CALCIUM 8.1* 7.8* 7.9* 7.7*   Liver Function Tests:  Recent Labs Lab 06/30/13 0502 07/01/13 0420 07/02/13 0546  AST 24 23 24   ALT 13 13 13   ALKPHOS 170* 180* 190*  BILITOT 0.6 0.4 0.5  PROT 5.5* 5.6* 5.4*  ALBUMIN 2.5* 2.5* 2.4*   No results found for this basename: LIPASE, AMYLASE,  in the last 168 hours No results found for this basename: AMMONIA,  in the last 168 hours CBC:  Recent Labs Lab 06/29/13 1434 06/30/13 0502 07/01/13 0420 07/02/13 0546  WBC 16.9* 13.2* 11.9* 11.7*  NEUTROABS  --  10.9* 9.7* 9.6*  HGB 12.2 11.2* 11.3* 11.4*  HCT 38.4 34.7* 34.7* 35.5*  MCV 84.0 82.4 83.0 83.1  PLT 273 241 247 229   Cardiac Enzymes:  Recent Labs Lab 06/29/13 2147 06/30/13 0335 06/30/13 0925  TROPONINI <0.30 <0.30 <0.30   BNP: No components found with this basename: POCBNP,  CBG: No results found for this basename: GLUCAP,  in the last 168 hours  No results found for this or any previous visit (from the past 240 hour(s)).   Studies: No results found.  Scheduled Meds: . calcium carbonate  1 tablet Oral Q breakfast  . feeding supplement (ENSURE COMPLETE)  237 mL Oral BID BM  . furosemide  20 mg Intravenous BID  . levothyroxine  88 mcg Oral QAC breakfast  . metoprolol succinate  25 mg Oral Daily  . multivitamin with minerals  1 tablet Oral Daily  .  multivitamin-lutein  1 capsule Oral Daily  . pantoprazole  40 mg Oral Daily  . potassium chloride  10 mEq Oral Daily  . sodium chloride  3 mL Intravenous Q12H  . warfarin  0.5 mg Oral ONCE-1800  . Warfarin - Pharmacist Dosing Inpatient   Does not apply q1800   Continuous Infusions:

## 2013-07-02 NOTE — Care Management Note (Signed)
    Page 1 of 2   07/02/2013     10:59:45 AM   CARE MANAGEMENT NOTE 07/02/2013  Patient:  Wendy Garner, Wendy Garner   Account Number:  [id number]  Date Initiated:  07/01/2013  Documentation initiated by:  [facility]  Subjective/Objective Assessment:   adm: worsening shortness of breath     Action/Plan:   discharge planning   Anticipated DC Date:  07/01/2013   Anticipated DC Plan:  [place]  In-house referral  Clinical Social Worker      DC Planning Services  CM consult      Choice offered to / List presented to:             Status of service:  Completed, signed off Medicare Important Message given?   (If response is "NO", the following Medicare IM given date fields will be blank) Date Medicare IM given:   Date Additional Medicare IM given:    Discharge Disposition:  [place]  Per UR Regulation:  Reviewed for med. necessity/level of care/duration of stay  If discussed at [place] of Stay Meetings, dates discussed:    Comments:  07-02-13 1053 Jacqlyn Krauss, RN,BSN [phone] CM did speak to family in regards to observation status and now meeting for inpatient status. CM did speak to pt's son and daughter in reference to disposition needs. Pt is now on IV lasix bid due to worsening SOB and the plan will be for SNF when medically stable. Family has chosen [facility] sales [facility] and [place] SNF. CSW is aware of plan. No further needs form CM at this time.   07/01/13 16 :30 CM received call from [place] as pt does not qualify for SNF (2 days OBS) and is medically stable for discharge.  CM and CSW went to room but family had just left.  Pt requested CM call Katrina.  CM called Katrina and I explained her mother was medically stable for discharge and if family wanted to continue to seek placement for pt, they could pursue this with a HHSW.  Katrina and brother Deborah Chalk came back to hospital to discuss disposition.  Family verbalized understanding Medicare  will not pay for SNF at this time bc pt does not fill INPATIENTx3 criteria.  Family also verbalized understanding Medicare would not likely cover for this night's hospital stay, however, they state they cannot take care of their mother at home.  CSW and CM had placed several calls to MD and while in room, MD called back and spoke with [facility] on phone and to CM.  Md has scheduled meeting with family in the am to discuss goals of care.  CM will continue to follow for [facility] needs.  Mariane Masters, [place], [place].

## 2013-07-02 NOTE — Progress Notes (Signed)
UR completed. Patient changed to inpatient- requiring IV lasix BID  

## 2013-07-02 NOTE — Progress Notes (Signed)
Occupational Therapy Treatment Patient Details Name: Wendy Garner MRN: [id number] DOB: Sep 03, 1915 Today's Date: 07/02/2013    History of present illness Adm 3/27 with dyspnea. ?CHF vs metastatic disease (h/o colon Ca with mets to lungs and bones, T3-4)   OT comments  Pt fatigues rapidly with minimal activity.  She demonstrated dyspnea 3-4/4 sats 96-98% on RA.  She requires multiple long seated rest breaks to complete simple grooming tasks.   Follow Up Recommendations  SNF;Supervision/Assistance - 24 hour    Equipment Recommendations  None recommended by OT    Recommendations for Other Services      Precautions / Restrictions Precautions Precautions: Fall       Mobility Bed Mobility Overal bed mobility: Needs Assistance Bed Mobility: Supine to Sit;Sit to Supine     Supine to sit: Supervision Sit to supine: Min assist   General bed mobility comments: pt requested assist for LEs  Transfers Overall transfer level: Needs assistance Equipment used: Rolling walker (2 wheeled);1 person hand held assist Transfers: Sit to/from [facility] Sit to Stand: Min guard Stand pivot transfers: Min guard            Balance Overall balance assessment: Needs assistance         Standing balance support: Single extremity supported Standing balance-Leahy Scale: Poor Standing balance comment: Pt props on Rt. UE when standing, Unsure that this is due to balance but more because of dyspnea and fatigue                   ADL   Grooming: Standing;Oral care;Wash/dry face (min guard assist)       Lower Body Dressing: Min guard;Sit to/from stand Toilet Transfer: Min guard;Ambulation;Comfort height toilet Toileting- Clothing Manipulation and Hygiene: Min guard;Sit to/from stand   Functional mobility during ADLs: Min guard General ADL Comments: Pt fatigues rapidly.  Dyspnea 3-4/4.  Requires frequent long seated rest breaks.  02 sats 96-99 on RA        Vision                     Perception     Praxis      Cognition   Behavior During Therapy: WFL for tasks assessed/performed Overall Cognitive Status: Within Functional Limits for tasks assessed                       Extremity/Trunk Assessment               Exercises       General Comments      Pertinent Vitals/ Pain         Home Living                                          Prior Functioning/Environment              Frequency Min 2X/week     Progress Toward Goals  OT Goals(current goals can now be found in the care plan section)  Progress towards OT goals: Not progressing toward goals - comment (fatigues rapidly)  ADL Goals Pt Will Perform Grooming: with modified independence;standing Pt Will Perform Upper Body Bathing: with modified independence;sitting Pt Will Perform Lower Body Bathing: with modified independence;sit to/from stand Pt Will Transfer to Toilet: with modified independence;ambulating Pt Will Perform Toileting - Clothing Manipulation and hygiene: with modified independence;sit  to/from stand Additional ADL Goal #1: Pt will independently verbalize 3/3 energy conservation techniques.  Plan Discharge plan remains appropriate    End of Session Equipment Utilized During Treatment: Rolling walker  Activity Tolerance Patient limited by fatigue   Patient Left in bed;with call bell/phone within reach   Nurse Communication Mobility status        Time: 7680-8811 OT Time Calculation (min): 23 min  Charges: OT General Charges $OT Visit: 1 Procedure OT Treatments $Self Care/Home Management : 23-37 mins  Hadley Detloff M 07/02/2013, 2:36 PM

## 2013-07-02 NOTE — Progress Notes (Signed)
Physical Therapy Treatment Patient Details Name: Wendy Garner MRN: [id number] DOB: October 09, 1915 Today's Date: 07/02/2013    History of Present Illness 80 y.o. female admitted to [facility] on 06/29/13 with dyspnea.  Chest CT revealed multiple lung nodules, a liver nodule and T2-3 lesions on the spine indicating metatstatic disease.  Pt with significant PMHx of a-fib (on coumadin), CHF, HTN, and colon CA s/p resection.  She is now being followed by Palliative care.      PT Comments    Pt is very limited by fatigue and DOE with mobility.  She continues to be very willing to participate and "do what I can".  Flat affect likely due to the grave nature of her condition.  PT will continue to follow acutely and pt continues to be appropriate for SNF level rehab at discharge.    Follow Up Recommendations  SNF     Equipment Recommendations  None recommended by PT    Recommendations for Other Services   NA     Precautions / Restrictions Precautions Precautions: Fall Precaution Comments: generally weak    Mobility  Bed Mobility Overal bed mobility: Needs Assistance Bed Mobility: Supine to Sit Rolling: Modified independent (Device/Increase time) (bed rail)   Supine to sit: Supervision Sit to supine: Min assist   General bed mobility comments: Pt using railing to pull to sitting.  Needed extra time to complete task  Transfers Overall transfer level: Needs assistance Equipment used: Rolling walker (2 wheeled) Transfers: Sit to/from Stand Sit to Stand: Min guard Stand pivot transfers: Min guard       General transfer comment: Min guard assist to support trunk for balance and safety during transitions from higher bed and lower commode seat.    Ambulation/Gait Ambulation/Gait assistance: Min guard Ambulation Distance (Feet): 20 Feet Assistive device: Rolling walker (2 wheeled) Gait Pattern/deviations: Step-through pattern;Shuffle;Trunk flexed     General Gait Details: pt with  shuffling, flexed gait.  Verbal cues for upright posture and to stay closer to the RW.  Min assist provided at trunk for safety and balance during gait.  Pt fatigued quickly and got DOE with only a little mobility 3/4 by the time we got back to the recliner chair from the bathroom).  O2 sats and HR remain stable despite DOE on RA.            Balance Overall balance assessment: Needs assistance Sitting-balance support: Feet supported Sitting balance-Leahy Scale: Good     Standing balance support: Bilateral upper extremity supported Standing balance-Leahy Scale: Poor Standing balance comment: Pt needs extremity support to maintain balance in standing and likely for energy conservation as well.                      Cognition Arousal/Alertness: Awake/alert Behavior During Therapy: Flat affect Overall Cognitive Status: Within Functional Limits for tasks assessed                             Pertinent Vitals/Pain  07/02/13 1538  Vital Signs  Pulse Rate 71  Pain Assessment  Pain Assessment No/denies pain  Pain Score 0  Oxygen Therapy  SpO2 97 %  O2 Device None (Room air)              PT Goals (current goals can now be found in the care plan section) Acute Rehab PT Goals Patient Stated Goal: to feel stronger, go home Progress towards PT goals: Progressing toward  goals    Frequency  Min 2X/week    PT Plan Current plan remains appropriate;Frequency needs to be updated    End of Session Equipment Utilized During Treatment: Gait belt Activity Tolerance: Patient limited by fatigue Patient left: in chair;with call bell/phone within reach     Time: 7096-4383 PT Time Calculation (min): 14 min  Charges:  $Therapeutic Activity: 8-22 mins                      Ronald Londo B. [place], [place], DPT [phone]   07/02/2013, 4:47 PM

## 2013-07-02 NOTE — Progress Notes (Signed)
ANTICOAGULATION CONSULT NOTE - Follow Up Consult  Pharmacy Consult for Coumadin Indication: atrial fibrillation  No Known Allergies  Patient Measurements: Height: 5' 8"  (172.7 cm) Weight: 131 lb 11.2 oz (59.739 kg) IBW/kg (Calculated) : 63.9  Vital Signs: Temp: 97.9 F (36.6 C) (03/30 0543) Temp src: Oral (03/30 0543) BP: 115/60 mmHg (03/30 0543) Pulse Rate: 74 (03/30 0543)  Labs:  Recent Labs  06/29/13 2147 06/30/13 0335 06/30/13 0502 06/30/13 0925 07/01/13 0420 07/02/13 0546  HGB  --   --  11.2*  --  11.3* 11.4*  HCT  --   --  34.7*  --  34.7* 35.5*  PLT  --   --  241  --  247 229  LABPROT  --   --  23.1*  --  26.4* 30.0*  INR  --   --  2.12*  --  2.53* 2.99*  CREATININE  --   --  1.12*  --  1.24* 1.08  TROPONINI <0.30 <0.30  --  <0.30  --   --     Estimated Creatinine Clearance: 29.4 ml/min (by C-G formula based on Cr of 1.08).  Assessment: 89yof continues on coumadin for afib. INR is therapeutic, but has been trending up over the last few days on her new home dose. Likely due to CHF exacerbation as MD switching patient to IV lasix due to worsening SOB/elevated BNP. Also, AST/ALT wnl but alk phos elevated. CBC is stable. No bleeding reported.  Coumadin doses held on 3/25 and 3/26 for supratherapeutic INR. New home Coumadin dose is 1 mg daily except 2 mg on Tuesday and Saturday. Had been on 2 mg daily except 1 mg on Tuesday and Friday prior to 3/25.   Goal of Therapy:  INR 2-3 Monitor platelets by anticoagulation protocol: Yes   Plan:  1) Decrease coumadin to 0.21m x 1 2) INR in AM  MDeboraha Sprang3/30/2015,10:07 AM

## 2013-07-03 DIAGNOSIS — Z515 Encounter for palliative care: Secondary | ICD-10-CM

## 2013-07-03 DIAGNOSIS — I509 Heart failure, unspecified: Secondary | ICD-10-CM

## 2013-07-03 LAB — CBC WITH DIFFERENTIAL/PLATELET
BASOS ABS: 0 10*3/uL (ref 0.0–0.1)
Basophils Relative: 0 % (ref 0–1)
EOS ABS: 0.3 10*3/uL (ref 0.0–0.7)
Eosinophils Relative: 2 % (ref 0–5)
HCT: 35.1 % — ABNORMAL LOW (ref 36.0–46.0)
Hemoglobin: 11.4 g/dL — ABNORMAL LOW (ref 12.0–15.0)
Lymphocytes Relative: 8 % — ABNORMAL LOW (ref 12–46)
Lymphs Abs: 1.1 10*3/uL (ref 0.7–4.0)
MCH: 27 pg (ref 26.0–34.0)
MCHC: 32.5 g/dL (ref 30.0–36.0)
MCV: 83.2 fL (ref 78.0–100.0)
MONO ABS: 1.1 10*3/uL — AB (ref 0.1–1.0)
Monocytes Relative: 8 % (ref 3–12)
NEUTROS ABS: 11.6 10*3/uL — AB (ref 1.7–7.7)
NEUTROS PCT: 82 % — AB (ref 43–77)
Platelets: 226 10*3/uL (ref 150–400)
RBC: 4.22 MIL/uL (ref 3.87–5.11)
RDW: 15.3 % (ref 11.5–15.5)
WBC: 14.2 10*3/uL — ABNORMAL HIGH (ref 4.0–10.5)

## 2013-07-03 LAB — COMPREHENSIVE METABOLIC PANEL
ALT: 14 U/L (ref 0–35)
AST: 26 U/L (ref 0–37)
Albumin: 2.4 g/dL — ABNORMAL LOW (ref 3.5–5.2)
Alkaline Phosphatase: 198 U/L — ABNORMAL HIGH (ref 39–117)
BILIRUBIN TOTAL: 0.5 mg/dL (ref 0.3–1.2)
BUN: 28 mg/dL — AB (ref 6–23)
CHLORIDE: 102 meq/L (ref 96–112)
CO2: 25 mEq/L (ref 19–32)
CREATININE: 1.16 mg/dL — AB (ref 0.50–1.10)
Calcium: 7.6 mg/dL — ABNORMAL LOW (ref 8.4–10.5)
GFR calc Af Amer: 45 mL/min — ABNORMAL LOW (ref >90)
GFR calc non Af Amer: 39 mL/min — ABNORMAL LOW (ref >90)
Glucose, Bld: 113 mg/dL — ABNORMAL HIGH (ref 70–99)
Potassium: 4.6 mEq/L (ref 3.7–5.3)
Sodium: 139 mEq/L (ref 137–147)
TOTAL PROTEIN: 5.4 g/dL — AB (ref 6.0–8.3)

## 2013-07-03 LAB — PROTIME-INR
INR: 2.73 — AB (ref 0.00–1.49)
PROTHROMBIN TIME: 28 s — AB (ref 11.6–15.2)

## 2013-07-03 LAB — PRO B NATRIURETIC PEPTIDE: PRO B NATRI PEPTIDE: 1416 pg/mL — AB (ref 0–450)

## 2013-07-03 MED ORDER — SENNOSIDES-DOCUSATE SODIUM 8.6-50 MG PO TABS
2.0000 | ORAL_TABLET | Freq: Every evening | ORAL | Status: DC | PRN
  Filled 2013-07-03: qty 2

## 2013-07-03 MED ORDER — FUROSEMIDE 10 MG/ML IJ SOLN
10.0000 mg | Freq: Two times a day (BID) | INTRAMUSCULAR | Status: DC
  Administered 2013-07-03 – 2013-07-04 (×2): 10 mg via INTRAVENOUS
  Filled 2013-07-03 (×2): qty 1

## 2013-07-03 MED ORDER — WARFARIN SODIUM 2 MG PO TABS
2.0000 mg | ORAL_TABLET | Freq: Once | ORAL | Status: AC
  Administered 2013-07-03: 2 mg via ORAL
  Filled 2013-07-03: qty 1

## 2013-07-03 MED ORDER — MORPHINE SULFATE (CONCENTRATE) 10 MG /0.5 ML PO SOLN
5.0000 mg | ORAL | Status: DC | PRN
  Administered 2013-07-04 (×2): 5 mg via ORAL
  Filled 2013-07-03 (×2): qty 0.5

## 2013-07-03 NOTE — Consult Note (Signed)
Patient Wendy Garner      DOB: 1915-06-08      ONG:[id number]     Consult Note from the Palliative Medicine Team at [place] Requested by: Dr. Adora Fridge   PCP: Purvis Kilts, MD Reason for Consultation: [place] and options.  Phone Number:[phone]  Assessment of patients Current state: Ms. Escudero is a 80 yo female admitted with progressive worsening of dyspnea and weakness. PMH significant for atrial fibrillation, pacemaker, diastolic CHF (EF ), HTN, colon cancer s/p partial colectomy 2002 (no further treatment required). CT chest to r/o PE found multiple pulmonary nodules suspicious for neoplasm/metastatic disease and peritoneal carcinomatosis (abnormal liver with cirrhotic features and sclerotic T4, T3 vertebrae). CT results may include metastatic disease from breast or hepatocellular carcinoma. She has been treated with IV lasix to control her dyspnea and is awaiting SNF placement.  I met today with Ms. Werden, her daughter Adonis Huguenin [phone], [phone]), son Lavell Luster [phone], [phone]) and his friend Joaquim Lai, and granddaughter Dawn (RN [facility]). We discussed overall goals of care today. We discussed the natural progression of disease given her probable cancer, atrial fib, and CHF. They all agree that they would like to focus on comfort and that she should not suffer. She does not wish for any further testing for her carcinoma finding and does not wish for any treatment. She is concerned about her weakness/fatigue (having no energy) and shortness of breath. We discussed comfort and hospice care and they are receptive to this information. MOST form was completed: DNR, limited additional interventions (Ms. Finnigan would like to consider readmission to hospital ONLY IF there is something that can be treated/fixed to make her feel better), consider antibiotics, consider IV fluids for trial period, and no feeding tube. The would like her to be placed in SNF near  [place] with palliative or hospice to follow with the goal to transition to hospice facility when appropriate. I will continue to follow and support this patient and family.   Goals of Care: 1.  Code Status: DNR   2. Scope of Treatment: Continue medical treatment with focus on comfort.    4. Disposition: SNF with palliative or hospice.    3. Symptom Management:   1. Pain/shortness of breath: Low dose roxanol prn.  2. Bowel Regimen: Senna S prn.  3. Weakness: Continue medical management. PT/OT following.   4. Psychosocial: Emotional support provided to patient and family during difficult conversation and decisions.  5. Spiritual: Support from personal church and pastor.    Patient Documents Completed or Given: Document Given Completed  Advanced Directives Pkt    MOST  yes  DNR  yes  Gone from My Sight    Hard Choices      Brief HPI: 80 yo female admitted with progressive worsening of dyspnea and weakness. PMH significant for atrial fibrillation, pacemaker, diastolic CHF (EF ), HTN, colon cancer s/p partial colectomy 2002 (no further treatment required). CT chest to r/o PE found multiple pulmonary nodules suspicious for neoplasm/metastatic disease and peritoneal carcinomatosis (abnormal liver with cirrhotic features and sclerotic T4, T3 vertebrae). CT results may include metastatic disease from breast or hepatocellular carcinoma. She has been treated with IV lasix to control her dyspnea and is awaiting SNF placement.    ROS: Denies constipation/nausea, + shortness of breath, + intermittent dull pains to RUQ abdomen (lasting few seconds only and relieved by reposition)    PMH:  Past Medical History  Diagnosis Date  . Macular degeneration   .  Peptic ulcer disease     with history of upper GI bleeding in 2004  . Degenerative joint disease     hip and lumbosacral spine  . Adenocarcinoma of colon   . Sick sinus syndrome     Paroxysmal atrial fibrillation with a rapid  ventricular response;   . Atrial fibrillation   . Bradycardia, severe sinus     Extreme; junctional bradycardia and sinus arrest up to 4 seconds     PSH: Past Surgical History  Procedure Laterality Date  . Cataract extraction    . Cholecystectomy    . Tonsillectomy    . Partial colectomy  2002    Partial colectomy for carcinoma of the colon-2002   I have reviewed the FH and SH and  If appropriate update it with new information. No Known Allergies Scheduled Meds: . calcium carbonate  1 tablet Oral Q breakfast  . feeding supplement (ENSURE COMPLETE)  237 mL Oral BID BM  . furosemide  10 mg Intravenous BID  . levothyroxine  88 mcg Oral QAC breakfast  . metoprolol succinate  25 mg Oral Daily  . multivitamin with minerals  1 tablet Oral Daily  . multivitamin-lutein  1 capsule Oral Daily  . pantoprazole  40 mg Oral Daily  . potassium chloride  10 mEq Oral Daily  . sodium chloride  3 mL Intravenous Q12H  . warfarin  2 mg Oral ONCE-1800  . Warfarin - Pharmacist Dosing Inpatient   Does not apply q1800   Continuous Infusions:  PRN Meds:.    BP 101/54  Pulse 76  Temp(Src) 98.1 F (36.7 C) (Oral)  Resp 18  Ht _0  (1.727 m)  Wt 59.739 kg (131 lb 11.2 oz)  BMI 20.03 kg/m2  SpO2 95%   PPS: 30%   Intake/Output Summary (Last 24 hours) at 07/03/13 1410 Last data filed at 07/03/13 0900  Gross per 24 hour  Intake    600 ml  Output      0 ml  Net    600 ml   LBM: 07/02/13                         Physical Exam:  General: NAD, awake, alert HEENT: [place]/AT, moist mucous membranes, no scleral icterus Chest: Few bibasilar crackles, short of breath with conversation CVS: RRR, S1 S2 Abdomen: Soft, NT, mild distention, +BS Ext: MAE, no edema, warm to touch Neuro: Alert, oriented x 3, follows commands  Labs: CBC    Component Value Date/Time   WBC 14.2* 07/03/2013 0540   RBC 4.22 07/03/2013 0540   HGB 11.4* 07/03/2013 0540   HCT 35.1* 07/03/2013 0540   PLT 226 07/03/2013 0540    MCV 83.2 07/03/2013 0540   MCH 27.0 07/03/2013 0540   MCHC 32.5 07/03/2013 0540   RDW 15.3 07/03/2013 0540   LYMPHSABS 1.1 07/03/2013 0540   MONOABS 1.1* 07/03/2013 0540   EOSABS 0.3 07/03/2013 0540   BASOSABS 0.0 07/03/2013 0540    BMET    Component Value Date/Time   NA 139 07/03/2013 0540   K 4.6 07/03/2013 0540   CL 102 07/03/2013 0540   CO2 25 07/03/2013 0540   GLUCOSE 113* 07/03/2013 0540   BUN 28* 07/03/2013 0540   CREATININE 1.16* 07/03/2013 0540   CREATININE 0.94 09/30/2010 1435   CALCIUM 7.6* 07/03/2013 0540   GFRNONAA 39* 07/03/2013 0540   GFRAA 45* 07/03/2013 0540    CMP     Component Value  Date/Time   NA 139 07/03/2013 0540   K 4.6 07/03/2013 0540   CL 102 07/03/2013 0540   CO2 25 07/03/2013 0540   GLUCOSE 113* 07/03/2013 0540   BUN 28* 07/03/2013 0540   CREATININE 1.16* 07/03/2013 0540   CREATININE 0.94 09/30/2010 1435   CALCIUM 7.6* 07/03/2013 0540   PROT 5.4* 07/03/2013 0540   ALBUMIN 2.4* 07/03/2013 0540   AST 26 07/03/2013 0540   ALT 14 07/03/2013 0540   ALKPHOS 198* 07/03/2013 0540   BILITOT 0.5 07/03/2013 0540   GFRNONAA 39* 07/03/2013 0540   GFRAA 45* 07/03/2013 0540    Time In Time Out Total Time Spent with Patient Total Overall Time  1300 1430 71mn 974m    Greater than 50%  of this time was spent counseling and coordinating care related to the above assessment and plan.  AlVinie SillNP Palliative Medicine Team Pager # [phone]-F 8a-5p) Team Phone # [phone]/Weekends)

## 2013-07-03 NOTE — Progress Notes (Signed)
ANTICOAGULATION CONSULT NOTE - Follow Up Consult  Pharmacy Consult for Coumadin Indication: atrial fibrillation  No Known Allergies  Patient Measurements: Height: 5\' 8"  (172.7 cm) Weight: 131 lb 11.2 oz (59.739 kg) IBW/kg (Calculated) : 63.9  Vital Signs: Temp: 98.1 F (36.7 C) (03/31 0505) Temp src: Oral (03/31 0505) BP: 101/54 mmHg (03/31 0505) Pulse Rate: 76 (03/31 0505)  Labs:  Recent Labs  06/30/13 0925  07/01/13 0420 07/02/13 0546 07/03/13 0540  HGB  --   < > 11.3* 11.4* 11.4*  HCT  --   --  34.7* 35.5* 35.1*  PLT  --   --  247 229 226  LABPROT  --   --  26.4* 30.0* 28.0*  INR  --   --  2.53* 2.99* 2.73*  CREATININE  --   --  1.24* 1.08 1.16*  TROPONINI <0.30  --   --   --   --   < > = values in this interval not displayed.  Estimated Creatinine Clearance: 27.3 ml/min (by C-G formula based on Cr of 1.16).  Assessment: 89yof continues on coumadin for afib. INR is therapeutic. Dose was decreased yesterday since INR had been trending up and patient switched to IV lasix for CHF exac which can increase coumadin sensitivity. CBC stable. No bleeding reported.  Coumadin doses held on 3/25 and 3/26 for supratherapeutic INR. New home Coumadin dose is 1 mg daily except 2 mg on Tuesday and Saturday. Had been on 2 mg daily except 1 mg on Tuesday and Friday prior to 3/25.   Goal of Therapy:  INR 2-3 Monitor platelets by anticoagulation protocol: Yes   Plan:  1) Coumadin 2mg  x 1 tonight 2) INR in AM 3) Follow up palliative care meeting  Deboraha Sprang 07/03/2013,8:36 AM

## 2013-07-03 NOTE — Progress Notes (Signed)
Patient ID: Wendy Garner, female   DOB: 08/18/1915, 80 y.o.   MRN: [id number]  [facility] PROGRESS NOTE  NIQUITA DIGIOIA YQM:[id number] DOB: [birth year] DOA: 06/29/2013 PCP: Purvis Kilts, MD  Brief narrative: 80 year old female with past medical history of atrial flutter on coumadin, s/p pacemaker, diastolic CHF, hypertension, colon cancer s/p partial colectomy in 2002 who presented to [facility] ED 06/29/2013 with worsening shortness of breath for past 2 weeks prior to this admission. In addition, patient's family reported patient has been feeling weak and has had declined in regards to performing ADL's.   In ED, BP was 114/53, HR was 70, Tmax 98.6 F and oxygen saturation was 95% on room air. Chest x-ray showed CABG changes with mild bibasilar effusions, enlarged cardiac silhouette with pulmonary vascular congestion as well as a questionable right upper lobe density. BNP was elevated at 2300. Followup CT angio chest was negative for PE. There were multiple pulmonary nodules concerning for metastatic disease as well as liver nodules and T3, T4 vertebral lesions concerning for metastatic disease.   Assessment/Plan:  Principal Problem:  Shortness of breath  - Possibly related to acute diastolic CHF considering BNP elevated at 2300; patient was on low dose lasix, 20 mg PO daily and her creatinine was slightly above normal range - am of 3/31, pt more short of breath with crackles on exam and requiring change of Lasix to 20 mg BID IV - BNP in now in 1400 this AM, weight remains at 131 lbs and stable over the past 24 hours  - Cardiac enzymes x 1 set WNL. 2-D ECHO shows EF 55%  - She does have possible pulmonary mets as evident on CT chest; I have reviewed the findings with the patient and the patient's family have explained to her that CT scan is suggestive of metastatic disease. She said she will speak with her family about the findings and will see what the plan would be  Active  Problems:  HYPERTENSION  - continue metoprolol 25 mg daily  Atrial flutter, fibrillation  - anticoagulation with coumadin  - rate controlled with metoprolol 25 mg daily  - INR at therapeutic level; no bleeding  Cardiac pacemaker in situ  - stable  Chronic diastolic heart failure  - BNP 2300 on admission; pt is clinically improving with less shortness of breath and BNP down in 1400 this AM - strict intake and output  - replace electrolytes as needed  Acute renal failure  - likely due to lasix given overnight - Cr is still around her baseline - plan on changing Lasix to 10 mg IV BID  Anemia of chronic disease  - secondary to history of colon cancer  - hemoglobin WNL on admission  Leukocytosis  - no evidence of pneumonia or UTI - likely reactive from metastatic disease  Colon cancer metastasized to multiple sites  - palliative care consult for goals of care  Moderate protein-calorie malnutrition  - nutrition consulted  - continue multivitamins  H/O PUD  - continue PPI therapy   Code Status: DNR/DNI  Family Communication: family at the bedside  Disposition Plan: PT recommended SNF; pt agreeable to go to SNF   Consultants:  PCT Procedures:  None  Antibiotics:  None   Leisa Lenz, MD  [facility] Pager [phone]  If 7PM-7AM, please contact night-coverage [email or web address] Password [email or web address] 07/03/2013, 9:05 AM   LOS: 4 days   HPI/Subjective: No events overnight.   Objective: Filed Vitals:   07/02/13 1423 07/02/13  1538 07/02/13 2057 07/03/13 0505  BP:   100/52 101/54  Pulse:  71 70 76  Temp:   98 F (36.7 C) 98.1 F (36.7 C)  TempSrc:   Oral Oral  Resp:   18 18  Height:      Weight:      SpO2: 96% 97% 96% 95%    Intake/Output Summary (Last 24 hours) at 07/03/13 0905 Last data filed at 07/02/13 1735  Gross per 24 hour  Intake    480 ml  Output      0 ml  Net    480 ml    Exam:   General:  Pt is alert and oriented to name only, follows commands  appropriately, not in acute distress  Cardiovascular: Regular rate and rhythm, no rubs, no gallops  Respiratory: Mild bibasilar crackles  Abdomen: Soft, non tender, non distended, bowel sounds present, no guarding  Extremities: No edema, pulses DP and PT palpable bilaterally  Data Reviewed: Basic Metabolic Panel:  Recent Labs Lab 06/29/13 1434 06/30/13 0502 07/01/13 0420 07/02/13 0546 07/03/13 0540  NA 141 139 140 140 139  K 5.2 4.3 4.4 4.5 4.6  CL 105 102 103 103 102  CO2 22 23 24 24 25   GLUCOSE 113* 105* 107* 110* 113*  BUN 24* 22 28* 26* 28*  CREATININE 1.14* 1.12* 1.24* 1.08 1.16*  CALCIUM 8.1* 7.8* 7.9* 7.7* 7.6*   Liver Function Tests:  Recent Labs Lab 06/30/13 0502 07/01/13 0420 07/02/13 0546 07/03/13 0540  AST 24 23 24 26   ALT 13 13 13 14   ALKPHOS 170* 180* 190* 198*  BILITOT 0.6 0.4 0.5 0.5  PROT 5.5* 5.6* 5.4* 5.4*  ALBUMIN 2.5* 2.5* 2.4* 2.4*   No results found for this basename: LIPASE, AMYLASE,  in the last 168 hours No results found for this basename: AMMONIA,  in the last 168 hours CBC:  Recent Labs Lab 06/29/13 1434 06/30/13 0502 07/01/13 0420 07/02/13 0546 07/03/13 0540  WBC 16.9* 13.2* 11.9* 11.7* 14.2*  NEUTROABS  --  10.9* 9.7* 9.6* 11.6*  HGB 12.2 11.2* 11.3* 11.4* 11.4*  HCT 38.4 34.7* 34.7* 35.5* 35.1*  MCV 84.0 82.4 83.0 83.1 83.2  PLT 273 241 247 229 226   Cardiac Enzymes:  Recent Labs Lab 06/29/13 2147 06/30/13 0335 06/30/13 0925  TROPONINI <0.30 <0.30 <0.30    Studies: No results found.  Scheduled Meds: . calcium carbonate  1 tablet Oral Q breakfast  . furosemide  20 mg Intravenous BID  . levothyroxine  88 mcg Oral QAC breakfast  . metoprolol succinate  25 mg Oral Daily  . multivitamin-lutein  1 capsule Oral Daily  . pantoprazole  40 mg Oral Daily  . potassium chloride  10 mEq Oral Daily  . sodium chloride  3 mL Intravenous Q12H  . warfarin  2 mg Oral ONCE-1800   Continuous Infusions:

## 2013-07-03 NOTE — Telephone Encounter (Signed)
Go to ER if you cannot breath.

## 2013-07-04 DIAGNOSIS — C801 Malignant (primary) neoplasm, unspecified: Secondary | ICD-10-CM

## 2013-07-04 DIAGNOSIS — I5033 Acute on chronic diastolic (congestive) heart failure: Principal | ICD-10-CM

## 2013-07-04 DIAGNOSIS — I10 Essential (primary) hypertension: Secondary | ICD-10-CM

## 2013-07-04 DIAGNOSIS — E440 Moderate protein-calorie malnutrition: Secondary | ICD-10-CM

## 2013-07-04 LAB — CBC WITH DIFFERENTIAL/PLATELET
Basophils Absolute: 0 10*3/uL (ref 0.0–0.1)
Basophils Relative: 0 % (ref 0–1)
EOS ABS: 0.3 10*3/uL (ref 0.0–0.7)
Eosinophils Relative: 2 % (ref 0–5)
HCT: 35.6 % — ABNORMAL LOW (ref 36.0–46.0)
HEMOGLOBIN: 11.7 g/dL — AB (ref 12.0–15.0)
LYMPHS ABS: 1 10*3/uL (ref 0.7–4.0)
LYMPHS PCT: 7 % — AB (ref 12–46)
MCH: 27 pg (ref 26.0–34.0)
MCHC: 32.9 g/dL (ref 30.0–36.0)
MCV: 82.2 fL (ref 78.0–100.0)
MONOS PCT: 8 % (ref 3–12)
Monocytes Absolute: 1.2 10*3/uL — ABNORMAL HIGH (ref 0.1–1.0)
NEUTROS ABS: 12.4 10*3/uL — AB (ref 1.7–7.7)
Neutrophils Relative %: 83 % — ABNORMAL HIGH (ref 43–77)
PLATELETS: 233 10*3/uL (ref 150–400)
RBC: 4.33 MIL/uL (ref 3.87–5.11)
RDW: 15.4 % (ref 11.5–15.5)
WBC: 14.9 10*3/uL — AB (ref 4.0–10.5)

## 2013-07-04 LAB — COMPREHENSIVE METABOLIC PANEL
ALK PHOS: 215 U/L — AB (ref 39–117)
ALT: 14 U/L (ref 0–35)
AST: 27 U/L (ref 0–37)
Albumin: 2.4 g/dL — ABNORMAL LOW (ref 3.5–5.2)
BILIRUBIN TOTAL: 0.5 mg/dL (ref 0.3–1.2)
BUN: 32 mg/dL — AB (ref 6–23)
CHLORIDE: 101 meq/L (ref 96–112)
CO2: 23 mEq/L (ref 19–32)
Calcium: 7.8 mg/dL — ABNORMAL LOW (ref 8.4–10.5)
Creatinine, Ser: 1.18 mg/dL — ABNORMAL HIGH (ref 0.50–1.10)
GFR calc non Af Amer: 38 mL/min — ABNORMAL LOW (ref >90)
GFR, EST AFRICAN AMERICAN: 44 mL/min — AB (ref >90)
GLUCOSE: 119 mg/dL — AB (ref 70–99)
POTASSIUM: 4.4 meq/L (ref 3.7–5.3)
SODIUM: 137 meq/L (ref 137–147)
Total Protein: 5.5 g/dL — ABNORMAL LOW (ref 6.0–8.3)

## 2013-07-04 LAB — PROTIME-INR
INR: 2.58 — ABNORMAL HIGH (ref 0.00–1.49)
Prothrombin Time: 26.8 seconds — ABNORMAL HIGH (ref 11.6–15.2)

## 2013-07-04 LAB — PRO B NATRIURETIC PEPTIDE: Pro B Natriuretic peptide (BNP): 1475 pg/mL — ABNORMAL HIGH (ref 0–450)

## 2013-07-04 MED ORDER — FUROSEMIDE 20 MG PO TABS
20.0000 mg | ORAL_TABLET | Freq: Every day | ORAL | Status: DC
  Administered 2013-07-05: 20 mg via ORAL
  Filled 2013-07-04 (×2): qty 1

## 2013-07-04 MED ORDER — WARFARIN SODIUM 1 MG PO TABS
1.0000 mg | ORAL_TABLET | Freq: Once | ORAL | Status: AC
  Administered 2013-07-04: 1 mg via ORAL
  Filled 2013-07-04: qty 1

## 2013-07-04 MED ORDER — ZOLPIDEM TARTRATE 5 MG PO TABS: 5.0000 mg | ORAL_TABLET | Freq: Every evening | ORAL | Status: DC | PRN

## 2013-07-04 NOTE — Progress Notes (Signed)
ANTICOAGULATION CONSULT NOTE - Follow Up Consult  Pharmacy Consult for Coumadin Indication: atrial fibrillation  No Known Allergies  Patient Measurements: Height: 5\' 8"  (172.7 cm) Weight: 131 lb (59.421 kg) IBW/kg (Calculated) : 63.9  Vital Signs: Temp: 97.8 F (36.6 C) (04/01 0500) BP: 103/42 mmHg (04/01 0500) Pulse Rate: 71 (04/01 0500)  Labs:  Recent Labs  07/02/13 0546 07/03/13 0540 07/04/13 0513  HGB 11.4* 11.4* 11.7*  HCT 35.5* 35.1* 35.6*  PLT 229 226 233  LABPROT 30.0* 28.0* 26.8*  INR 2.99* 2.73* 2.58*  CREATININE 1.08 1.16* 1.18*    Estimated Creatinine Clearance: 26.7 ml/min (by C-G formula based on Cr of 1.18).  Assessment: 89yof continues on coumadin for afib. INR is therapeutic. CBC is stable. No bleeding reported. CHF improving per Dr. Algis Liming so likely will change IV lasix to PO. Will try to get her back on her home regimen.  Coumadin doses held on 3/25 and 3/26 for supratherapeutic INR. New home Coumadin dose is 1 mg daily except 2 mg on Tuesday and Saturday. Had been on 2 mg daily except 1 mg on Tuesday and Friday prior to 3/25.   Goal of Therapy:  INR 2-3 Monitor platelets by anticoagulation protocol: Yes   Plan:  1) Coumadin 1mg  x 1 2) INR in AM  Deboraha Sprang 07/04/2013,9:45 AM

## 2013-07-04 NOTE — Progress Notes (Signed)
Occupational Therapy Treatment and goal update Patient Details Name: Wendy Garner MRN: [id number] DOB: 1915-11-07 Today's Date: 07/04/2013    History of present illness 80 y.o. female admitted to [facility] on 06/29/13 with dyspnea.  Chest CT revealed multiple lung nodules, a liver nodule and T2-3 lesions on the spine indicating metatstatic disease.  Pt with significant PMHx of a-fib (on coumadin), CHF, HTN, and colon CA s/p resection.  She is now being followed by Palliative care.     OT comments  Pt with very limited endurance.  Goals downgraded on this visit.    Follow Up Recommendations  SNF (vs hospice)    Equipment Recommendations  None recommended by OT    Recommendations for Other Services      Precautions / Restrictions Precautions Precautions: Fall Precaution Comments: generally weak       Mobility Bed Mobility   Bed Mobility: Sit to Supine Rolling: Min assist         General bed mobility comments: assist for LEs  Transfers       Sit to Stand: Min guard         General transfer comment: close guard; did not need physical assist today for sit to stand; light support when walking for safety    Balance                                   ADL                     General ADL Comments: Pt walked across room to get back to bed with min guard for safety.  She had dyspnea 2/4.  Cued for pursed lip breathing.  Pt states she tries to do this but needs to work on slow exhalation.  Pt had sat up to eat breakfast and did not feel she needed to use bathroom prior to going back to bed. Also, did not feel up to brushing teeth at this time.  Reinforcecd energy conservation and rest breaks.        Vision                     Perception     Praxis      Cognition   Behavior During Therapy: WFL for tasks assessed/performed Overall Cognitive Status: Within Functional Limits for tasks assessed                        Extremity/Trunk Assessment               Exercises       General Comments      Pertinent Vitals/ Pain       No c/o pain; dyspnea 2/4  Home Living                                          Prior Functioning/Environment              Frequency Min 2X/week     Progress Toward Goals  OT Goals(current goals can now be found in the care plan section)  Progress towards OT goals: Not progressing toward goals - comment (fatigues quickly; goals changed)  Acute Rehab OT Goals Time For Goal Achievement: 07/18/13 ADL Goals Pt Will Transfer to Toilet: with supervision;bedside  commode;stand pivot transfer Pt Will Perform Toileting - Clothing Manipulation and hygiene: with supervision;sit to/from stand Additional ADL Goal #2: Pt will tolerate 20 minutes of activity with 3 rest breaks Additional ADL Goal #3: Pt will perform adl activities at set up/supervision level, sit to stand  Plan      End of Session    Activity Tolerance Patient limited by fatigue   Patient Left in bed;with call bell/phone within reach   Nurse Communication          Time: 3435-6861 OT Time Calculation (min): 8 min  Charges: OT General Charges $OT Visit: 1 Procedure OT Treatments $Self Care/Home Management : 8-22 mins  Eastyn Dattilo 07/04/2013, 8:57 AM Lesle Chris, OTR/L [phone] 07/04/2013

## 2013-07-04 NOTE — Progress Notes (Signed)
NUTRITION FOLLOW-UP   DOCUMENTATION CODES Per approved criteria  -Severe malnutrition in the context of chronic illness   INTERVENTION: 1.  Supplements; Ensure Complete po BID, each supplement provides 350 kcal and 13 grams of protein 2.  General healthful diet; encourage intake of foods and beverages as able.  RD to follow and assess for nutritional adequacy. Set intake goal of at least 50% of meals with pt.   NUTRITION DIAGNOSIS: Inadequate oral intake related to poor appetite as evidenced by pt report; ongoing.    Monitor:  1.  Food/Beverage; pt meeting >/=90% estimated needs with tolerance. 2.  Wt/wt change; monitor trends 3.  Labs; monitor trends.   ASSESSMENT: Pt admitted with shortness of breath and weakness.  Pt states she lives at home.  Pt with PMH of colon CA s/p partial colectomy in 2002.   CT scan in ED showed multiple pumonary nodules concerning for metastatic disease, as well as possible liver and spine mets.   Pt plans to d/c to SNF for hospice care.  Pt now followed by Palliative care for focus on comfortPer MD note pt plans for continued care but with focus on comfort. Pt continues to drink ensure, per pt she drinks about 1/2 of an ensure per day. Pt states that she likes the food but just cannot eat much of it. .   Pt meets criteria for severe MALNUTRITION in the context of chronic as evidenced by moderate subcutaneous wasting, severe muscle wasting, poor PO at home meeting <75% of estimated needs.  Height: Ht Readings from Last 1 Encounters:  06/29/13 5\' 8"  (1.727 m)    Weight: Wt Readings from Last 1 Encounters:  07/04/13 131 lb (59.421 kg)  Admission weight 131 lb (59.5 kg)  BMI:  Body mass index is 19.92 kg/(m^2).  Estimated Nutritional Needs: Kcal: 1400-1600 kcal Protein: 50-65g  Fluid: >1.5 L/day  Skin: no issues noted  Diet Order: Cardiac Meal Completion: 50%   Intake/Output Summary (Last 24 hours) at 07/04/13 1543 Last data filed at  07/03/13 1724  Gross per 24 hour  Intake    240 ml  Output      0 ml  Net    240 ml    Last BM: 3/31  Labs:   Recent Labs Lab 07/02/13 0546 07/03/13 0540 07/04/13 0513  NA 140 139 137  K 4.5 4.6 4.4  CL 103 102 101  CO2 24 25 23   BUN 26* 28* 32*  CREATININE 1.08 1.16* 1.18*  CALCIUM 7.7* 7.6* 7.8*  GLUCOSE 110* 113* 119*    CBG (last 3)  No results found for this basename: GLUCAP,  in the last 72 hours  Scheduled Meds: . calcium carbonate  1 tablet Oral Q breakfast  . feeding supplement (ENSURE COMPLETE)  237 mL Oral BID BM  . furosemide  20 mg Oral Daily  . levothyroxine  88 mcg Oral QAC breakfast  . metoprolol succinate  25 mg Oral Daily  . multivitamin with minerals  1 tablet Oral Daily  . multivitamin-lutein  1 capsule Oral Daily  . pantoprazole  40 mg Oral Daily  . potassium chloride  10 mEq Oral Daily  . sodium chloride  3 mL Intravenous Q12H  . warfarin  1 mg Oral ONCE-1800  . Warfarin - Pharmacist Dosing Inpatient   Does not apply q1800    Continuous Infusions:   [place], [place], [place] Pager [phone] After Hours Pager

## 2013-07-04 NOTE — Telephone Encounter (Signed)
Pt went to ER on 06-29-13

## 2013-07-04 NOTE — Progress Notes (Signed)
Patient ID: Wendy Garner, female   DOB: 1915-10-12, 80 y.o.   MRN: [id number]  [facility] PROGRESS NOTE  SPECIAL RANES VPX:[id number] DOB: [birth year] DOA: 06/29/2013 PCP: Purvis Kilts, MD  Brief narrative: 81 year old female with past medical history of atrial flutter on coumadin, s/p pacemaker, chronic diastolic CHF, hypertension, colon cancer s/p partial colectomy in 2002 who presented to [facility] ED 06/29/2013 with worsening shortness of breath for past 2 weeks prior to this admission. In addition, patient's family reported patient has been feeling weak and has had declined in regards to performing ADL's.   In ED, BP was 114/53, HR was 70, Tmax 98.6 F and oxygen saturation was 95% on room air. Chest x-ray showed CABG changes with mild bibasilar effusions, enlarged cardiac silhouette with pulmonary vascular congestion as well as a questionable right upper lobe density. BNP was elevated at 2300. Followup CT angio chest was negative for PE. There were multiple pulmonary nodules concerning for metastatic disease as well as liver nodules and T3, T4 vertebral lesions concerning for metastatic disease.   Assessment/Plan:  Principal Problem:  Shortness of breath  - Cardiac enzymes x 1 set WNL. 2-D ECHO shows EF 55%  - Probably multifactorial secondary to decompensated CHF and metastatic lung disease - Treated with IV Lasix and CHF is now compensated with no clinical features of volume overload. Patient however continues to be dyspneic even while talking-likely secondary to metastatic disease. - She does have possible pulmonary mets as evident on CT chest; Dr. Charlies Silvers reviewed the findings with the patient and the patient's family & explained to her that CT scan is suggestive of metastatic disease. She said she will speak with her family about the findings and will see what the plan would be   Active Problems:  HYPERTENSION  - continue metoprolol 25 mg daily  - Controlled  Atrial  flutter, fibrillation  - anticoagulation with coumadin  - rate controlled with metoprolol 25 mg daily  - INR at therapeutic level; no bleeding   Cardiac pacemaker in situ  - stable   Acute on Chronic diastolic heart failure  - BNP 2300 on admission; pt is clinically improving with less shortness of breath and BNP down in 1400 this AM - Currently seems compensated. DC IV Lasix.  Acute renal failure  - likely due to lasix given overnight - Cr is still around her baseline -Resolved  Anemia of chronic disease  - secondary to history of colon cancer   - hemoglobin WNL on admission   Leukocytosis  - no evidence of pneumonia or UTI - likely reactive from metastatic disease    Metastatic cancer-lungs, liver, skeletal, peritoneal - Unknown primary. May be breast cancer, HCC or colon cancer. - palliative care input appreciated-patient declines further workup or treatment for cancer. Focus is comfort  Severe protein-calorie malnutrition  - nutrition consulted  - continue multivitamins   H/O PUD  - continue PPI therapy   Code Status: DNR/DNI  Family Communication: None bedside  Disposition Plan: SNF possibly 4/2  Consultants:  Palliative care team Procedures:  None  Antibiotics:  None     Wendy Radloff, MD, FACP, FHM. [facility] Pager [phone]  If 7PM-7AM, please contact night-coverage [email or web address] Password [email or web address] 07/04/2013, 11:19 AM  Time spent: 25 minutes   LOS: 5 days   HPI/Subjective: States that dyspnea is better but not resolved. Unable to sleep at night and requests medications for same.  Objective: Filed Vitals:   07/03/13 0505 07/03/13 1441 07/03/13  2009 07/04/13 0500  BP: 101/54 139/65 133/68 103/42  Pulse: 76 81 80 71  Temp: 98.1 F (36.7 C) 97.4 F (36.3 C) 97.4 F (36.3 C) 97.8 F (36.6 C)  TempSrc: Oral Oral Oral   Resp: 18 17  16   Height:      Weight:    59.421 kg (131 lb)  SpO2: 95% 99% 100% 95%    Intake/Output Summary  (Last 24 hours) at 07/04/13 1113 Last data filed at 07/03/13 1724  Gross per 24 hour  Intake    240 ml  Output      0 ml  Net    240 ml    Exam:   General:  Pleasant elderly female sitting on chair than just finished eating breakfast. Appears comfortable at rest and becomes mildly dyspneic when she starts to talk.  Cardiovascular: Regular rate and rhythm, no rubs, no gallops. Telemetry: V. paced.  Respiratory: Clear to auscultation. Mild increased work of breathing while talking.  Abdomen: Soft, non tender, non distended, bowel sounds present, no guarding  Extremities: No edema, pulses DP and PT palpable bilaterally  CNS: Alert and oriented. No focal deficits. Hard of hearing.  Data Reviewed: Basic Metabolic Panel:  Recent Labs Lab 06/30/13 0502 07/01/13 0420 07/02/13 0546 07/03/13 0540 07/04/13 0513  NA 139 140 140 139 137  K 4.3 4.4 4.5 4.6 4.4  CL 102 103 103 102 101  CO2 23 24 24 25 23   GLUCOSE 105* 107* 110* 113* 119*  BUN 22 28* 26* 28* 32*  CREATININE 1.12* 1.24* 1.08 1.16* 1.18*  CALCIUM 7.8* 7.9* 7.7* 7.6* 7.8*   Liver Function Tests:  Recent Labs Lab 06/30/13 0502 07/01/13 0420 07/02/13 0546 07/03/13 0540 07/04/13 0513  AST 24 23 24 26 27   ALT 13 13 13 14 14   ALKPHOS 170* 180* 190* 198* 215*  BILITOT 0.6 0.4 0.5 0.5 0.5  PROT 5.5* 5.6* 5.4* 5.4* 5.5*  ALBUMIN 2.5* 2.5* 2.4* 2.4* 2.4*   No results found for this basename: LIPASE, AMYLASE,  in the last 168 hours No results found for this basename: AMMONIA,  in the last 168 hours CBC:  Recent Labs Lab 06/30/13 0502 07/01/13 0420 07/02/13 0546 07/03/13 0540 07/04/13 0513  WBC 13.2* 11.9* 11.7* 14.2* 14.9*  NEUTROABS 10.9* 9.7* 9.6* 11.6* 12.4*  HGB 11.2* 11.3* 11.4* 11.4* 11.7*  HCT 34.7* 34.7* 35.5* 35.1* 35.6*  MCV 82.4 83.0 83.1 83.2 82.2  PLT 241 247 229 226 233   Cardiac Enzymes:  Recent Labs Lab 06/29/13 2147 06/30/13 0335 06/30/13 0925  TROPONINI <0.30 <0.30 <0.30     Studies: No results found.  Scheduled Meds: . calcium carbonate  1 tablet Oral Q breakfast  . furosemide  20 mg Intravenous BID  . levothyroxine  88 mcg Oral QAC breakfast  . metoprolol succinate  25 mg Oral Daily  . multivitamin-lutein  1 capsule Oral Daily  . pantoprazole  40 mg Oral Daily  . potassium chloride  10 mEq Oral Daily  . sodium chloride  3 mL Intravenous Q12H  . warfarin  2 mg Oral ONCE-1800   Continuous Infusions:

## 2013-07-04 NOTE — Progress Notes (Signed)
Progress Note from the Palliative Medicine Team at [facility]: Wendy Garner is sitting on side of bed with her lunch tray. She ate a few bites of her pasta and ~1 cup of mandarin oranges. She says that she continues to have no appetite but will drink ensure when given to her. She says that she had much pain in her RUQ that kept her from sleeping last night until she accepted the roxanol ~0200 am and she says the pain went away and she was able to sleep. She requested sleep medication and Lorrin Mais has been ordered prn by Dr. Exie Parody. I spoke with nursing about utilizing pain medication if pain is the cause of her sleep disturbance. Her family is at bedside and are appreciative of our meeting. She has a bed at [facility] which is close to her home and family and are hopeful for discharge tomorrow.     Objective: No Known Allergies Scheduled Meds: . calcium carbonate  1 tablet Oral Q breakfast  . feeding supplement (ENSURE COMPLETE)  237 mL Oral BID BM  . furosemide  20 mg Oral Daily  . levothyroxine  88 mcg Oral QAC breakfast  . metoprolol succinate  25 mg Oral Daily  . multivitamin with minerals  1 tablet Oral Daily  . multivitamin-lutein  1 capsule Oral Daily  . pantoprazole  40 mg Oral Daily  . potassium chloride  10 mEq Oral Daily  . sodium chloride  3 mL Intravenous Q12H  . warfarin  1 mg Oral ONCE-1800  . Warfarin - Pharmacist Dosing Inpatient   Does not apply q1800   Continuous Infusions:  PRN Meds:.morphine CONCENTRATE, senna-docusate, zolpidem  BP 103/42  Pulse 71  Temp(Src) 97.8 F (36.6 C) (Oral)  Resp 16  Ht 5\' 8"  (1.727 m)  Wt 59.421 kg (131 lb)  BMI 19.92 kg/m2  SpO2 95%   PPS: 30%  Pain Score: denies    Intake/Output Summary (Last 24 hours) at 07/04/13 1318 Last data filed at 07/03/13 1724  Gross per 24 hour  Intake    240 ml  Output      0 ml  Net    240 ml      LBM: 07/03/13      Physical Exam:  General: NAD, awake, alert  HEENT:  [place]/AT, moist mucous membranes, no scleral icterus  Chest: Few bibasilar crackles, short of breath improved even with conversation  CVS: RRR, S1 S2  Abdomen: Soft, NT, mild distention, +BS  Ext: MAE, no edema, warm to touch  Neuro: Alert, oriented x 3, follows commands    Labs: CBC    Component Value Date/Time   WBC 14.9* 07/04/2013 0513   RBC 4.33 07/04/2013 0513   HGB 11.7* 07/04/2013 0513   HCT 35.6* 07/04/2013 0513   PLT 233 07/04/2013 0513   MCV 82.2 07/04/2013 0513   MCH 27.0 07/04/2013 0513   MCHC 32.9 07/04/2013 0513   RDW 15.4 07/04/2013 0513   LYMPHSABS 1.0 07/04/2013 0513   MONOABS 1.2* 07/04/2013 0513   EOSABS 0.3 07/04/2013 0513   BASOSABS 0.0 07/04/2013 0513    BMET    Component Value Date/Time   NA 137 07/04/2013 0513   K 4.4 07/04/2013 0513   CL 101 07/04/2013 0513   CO2 23 07/04/2013 0513   GLUCOSE 119* 07/04/2013 0513   BUN 32* 07/04/2013 0513   CREATININE 1.18* 07/04/2013 0513   CREATININE 0.94 09/30/2010 1435   CALCIUM 7.8* 07/04/2013 [place]  38* 07/04/2013 0513   GFRAA 44* 07/04/2013 0513    CMP     Component Value Date/Time   NA 137 07/04/2013 0513   K 4.4 07/04/2013 0513   CL 101 07/04/2013 0513   CO2 23 07/04/2013 0513   GLUCOSE 119* 07/04/2013 0513   BUN 32* 07/04/2013 0513   CREATININE 1.18* 07/04/2013 0513   CREATININE 0.94 09/30/2010 1435   CALCIUM 7.8* 07/04/2013 0513   PROT 5.5* 07/04/2013 0513   ALBUMIN 2.4* 07/04/2013 0513   AST 27 07/04/2013 0513   ALT 14 07/04/2013 0513   ALKPHOS 215* 07/04/2013 0513   BILITOT 0.5 07/04/2013 0513   GFRNONAA 38* 07/04/2013 0513   GFRAA 44* 07/04/2013 0513    Assessment and Plan: 1. Code Status: DNR 2. Symptom Control: 1. Pain/dyspnea: Roxanol prn. 2. Bowel Regimen: Senna S prn. 3. Sleep: Ambien qhs prn. 4. Weakness: Continue medical management. PT/OT following.   3. Psycho/Social: Emotional support provided to patient and family.  4. Spiritual: Support from personal church and pastor.  5. Disposition: SNF with palliative care services.     Time  In Time Out Total Time Spent with Patient Total Overall Time  1230 1300 30min 8min    Greater than 50%  of this time was spent counseling and coordinating care related to the above assessment and plan.  Vinie Sill, NP Palliative Medicine Team Pager # [phone] (M-F 8a-5p) Team Phone # [phone] (Nights/Weekends)

## 2013-07-05 ENCOUNTER — Other Ambulatory Visit: Payer: Self-pay | Admitting: *Deleted

## 2013-07-05 ENCOUNTER — Inpatient Hospital Stay
Admission: RE | Admit: 2013-07-05 | Discharge: 2013-07-10 | Disposition: A | Discharge status: Nursing Home (Includes ICF) | Payer: Medicare Other | Source: Ambulatory Visit | Attending: Internal Medicine | Admitting: Internal Medicine

## 2013-07-05 DIAGNOSIS — I4892 Unspecified atrial flutter: Secondary | ICD-10-CM

## 2013-07-05 LAB — PROTIME-INR
INR: 2.81 — AB (ref 0.00–1.49)
Prothrombin Time: 28.6 seconds — ABNORMAL HIGH (ref 11.6–15.2)

## 2013-07-05 MED ORDER — WARFARIN SODIUM 1 MG PO TABS
1.0000 mg | ORAL_TABLET | Freq: Once | ORAL | Status: DC
  Filled 2013-07-05: qty 1

## 2013-07-05 MED ORDER — MORPHINE SULFATE (CONCENTRATE) 10 MG /0.5 ML PO SOLN: 5.0000 mg | ORAL | Status: AC | PRN

## 2013-07-05 MED ORDER — ENSURE COMPLETE PO LIQD: 237.0000 mL | Freq: Two times a day (BID) | ORAL | Status: AC

## 2013-07-05 MED ORDER — WARFARIN SODIUM 1 MG PO TABS: 1.0000 mg | ORAL_TABLET | Freq: Every day | ORAL | Status: AC

## 2013-07-05 MED ORDER — ZOLPIDEM TARTRATE 5 MG PO TABS: 5.0000 mg | ORAL_TABLET | Freq: Every evening | ORAL | Status: AC | PRN

## 2013-07-05 NOTE — Progress Notes (Signed)
Clinical social worker assisted with patient discharge to skilled nursing facility, [facility].  CSW addressed all family questions and concerns. CSW copied chart and added all important documents. CSW also set up patient transportation with Diplomatic Services operational officer. Clinical Social Worker will sign off for now as social work intervention is no longer needed.   Rhea Pink, MSW, [place]

## 2013-07-05 NOTE — Discharge Summary (Signed)
Physician Discharge Summary  ADIVA BOETTNER KDX:[id number] DOB: 1916-03-27 DOA: 06/29/2013  PCP: Purvis Kilts, MD  Admit date: 06/29/2013 Discharge date: 07/05/2013  Time spent: Greater than 30 minutes  Recommendations for Outpatient Follow-up:  1. M.D. at SNF in 4-5 days, to be seen with repeat labs (CBC, PT, INR & BMP) 2. Recommend repeating TSH in 4-6 weeks. 3. Recommend palliative care consultation and followup at skilled nursing facility.  Discharge Diagnoses:  Principal Problem:   Shortness of breath Active Problems:   ADENOCARCINOMA, COLON   HYPERTENSION   Atrial flutter   Cardiac pacemaker in situ   Acute on chronic diastolic heart failure   Acute renal failure   Anemia of chronic disease   Metastatic cancer-lungs, liver, skeletal and peritoneal   Protein-calorie malnutrition, severe   CHF (congestive heart failure)   Palliative care encounter   Discharge Condition: Improved & Stable  Diet recommendation: Regular diet  Filed Weights   07/02/13 0543 07/04/13 0500 07/05/13 0500  Weight: 59.739 kg (131 lb 11.2 oz) 59.421 kg (131 lb) 58.922 kg (129 lb 14.4 oz)    History of present illness:  80 year old female with past medical history of atrial flutter on coumadin, s/p pacemaker, chronic diastolic CHF, hypertension, colon cancer s/p partial colectomy in 2002 who presented to [facility] ED 06/29/2013 with worsening shortness of breath for past 2 weeks prior to this admission. In addition, patient's family reported patient has been feeling weak and has had declined in regards to performing ADL's.  In ED, BP was 114/53, HR was 70, Tmax 98.6 F and oxygen saturation was 95% on room air. Chest x-ray showed CABG changes with mild bibasilar effusions, enlarged cardiac silhouette with pulmonary vascular congestion as well as a questionable right upper lobe density. BNP was elevated at 2300. Followup CT angio chest was negative for PE. There were multiple pulmonary nodules  concerning for metastatic disease as well as liver nodules and T3, T4 vertebral lesions concerning for metastatic disease.   Hospital Course:   Principal Problem:  Shortness of breath  - Cardiac enzymes x 1 set WNL. 2-D ECHO shows EF 55%  - Probably multifactorial secondary to decompensated CHF and metastatic lung disease  - Treated with IV Lasix and CHF is now compensated with no clinical features of volume overload. Patient however continues to be dyspneic even while talking-likely secondary to metastatic disease.  - She does have possible pulmonary mets as evident on CT chest;  - Discussed in detail with patient's family (son and daughter) on 4/2-updated hospital care including likely diagnosis of metastatic cancer, overall poor prognosis. Answered questions.  Active Problems:  HYPERTENSION  - continue metoprolol 25 mg daily  - Controlled  Atrial flutter, fibrillation  - anticoagulation with coumadin  - rate controlled with metoprolol 25 mg daily  - INR at therapeutic level; no bleeding - Patient apparently had supratherapeutic INR PTA and her Coumadin dose had been reduced. We'll discharge her on 1 mg daily and have her followup with PT/INR in 4-5 days.  Cardiac pacemaker in situ  - stable  Acute on Chronic diastolic heart failure  - BNP 2300 on admission; pt is clinically improving with less shortness of breath and BNP down in 1400 this AM  - Currently seems compensated. DC IV Lasix.  Acute renal failure  - likely due to lasix given overnight  - Cr is still around her baseline  -Resolved  Anemia of chronic disease  - Likely secondary to chronic disease and  underlying malignancy. - Stable Leukocytosis  - no evidence of pneumonia or UTI - likely reactive from metastatic disease  Metastatic cancer-lungs, liver, skeletal, peritoneal  - Unknown primary. May be breast cancer, HCC or colon cancer.  - palliative care input appreciated-patient declines further workup or treatment  for cancer. Focus is comfort  Severe protein-calorie malnutrition  - nutrition consulted  - continue multivitamins  H/O PUD  - continue PPI therapy  Hypothyroid - TSH is elevated but patient is clinically euthyroid. May be secondary to acute illness. Continue current dose of Synthroid and repeat TSH in 4-6 weeks.   Consultations:  Palliative care team  Procedures:  None    Discharge Exam:  Complaints:  Patient states that she slept much better last night. No significant pain issues this morning.  Filed Vitals:   07/04/13 1443 07/04/13 2100 07/05/13 0500 07/05/13 1021  BP: 132/62 98/46 104/58 115/68  Pulse: 86 70 72 73  Temp: 97.9 F (36.6 C) 98.7 F (37.1 C) 98.3 F (36.8 C)   TempSrc: Oral     Resp: 16 16 16    Height:      Weight:   58.922 kg (129 lb 14.4 oz)   SpO2: 94% 95% 94%     General: Pleasant elderly lying comfortably in bed.  Cardiovascular: Regular rate and rhythm, no rubs, no gallops.  Respiratory: Clear to auscultation. No increased work of breathing while talking.  Abdomen: Soft, non tender, non distended, bowel sounds present, no guarding  Extremities: No edema, pulses DP and PT palpable bilaterally  CNS: Alert and oriented. No focal deficits. Hard of hearing.   Discharge Instructions      Discharge Orders   Future Orders Complete By Expires   Call MD for:  difficulty breathing, headache or visual disturbances  As directed    Call MD for:  severe uncontrolled pain  As directed    Diet general  As directed    Increase activity slowly  As directed        Medication List         calcium carbonate 600 MG Tabs tablet  Commonly known as:  OS-CAL  Take 600 mg by mouth daily.     esomeprazole 20 MG capsule  Commonly known as:  NEXIUM  Take 20 mg by mouth daily at 12 noon.     feeding supplement (ENSURE COMPLETE) Liqd  Take 237 mLs by mouth 2 (two) times daily between meals.     furosemide 20 MG tablet  Commonly known as:  LASIX  Take  20 mg by mouth daily.     levothyroxine 88 MCG tablet  Commonly known as:  SYNTHROID, LEVOTHROID  Take 88 mcg by mouth daily.     metoprolol succinate 25 MG 24 hr tablet  Commonly known as:  TOPROL-XL  Take 1 tablet (25 mg total) by mouth daily.     morphine CONCENTRATE 10 mg / 0.5 ml concentrated solution  Take 0.25 mLs (5 mg total) by mouth every 2 (two) hours as needed for severe pain or shortness of breath.     multivitamin-lutein Caps capsule  Take 1 capsule by mouth daily.     CENTRUM SILVER ULTRA WOMENS PO  Take 1 tablet by mouth daily.     potassium chloride 10 MEQ tablet  Commonly known as:  K-DUR  Take 1 tablet (10 mEq total) by mouth daily.     warfarin 1 MG tablet  Commonly known as:  COUMADIN  Take 1 tablet (1  mg total) by mouth daily at 6 PM.     zolpidem 5 MG tablet  Commonly known as:  AMBIEN  Take 1 tablet (5 mg total) by mouth at bedtime as needed for sleep.          The results of significant diagnostics from this hospitalization (including imaging, microbiology, ancillary and laboratory) are listed below for reference.    Significant Diagnostic Studies: Dg Chest 2 View  06/29/2013   CLINICAL DATA:  Shortness of breath for 2 weeks worse today, numbness in hands and feet, exhaustion, pacemaker, atrial fibrillation, on blood thinners, history colon cancer  EXAM: CHEST  2 VIEW  COMPARISON:  09/03/2010  FINDINGS: Left subclavian transvenous pacemaker leads project at right atrium and right ventricle, stable.  Enlargement of cardiac silhouette with pulmonary vascular congestion.  Calcification aortic arch.  Large hiatal hernia.  Emphysematous changes with bibasilar effusions and atelectasis.  Questionable nodular density right upper lobe 9 mm diameter.  No definite infiltrate or pneumothorax.  New sclerotic upper thoracic vertebra proximally T5 highly worrisome for metastatic disease.  Degenerative disc disease changes at the thoracolumbar spine.  IMPRESSION:  COPD changes with bibasilar effusions and atelectasis.  Enlargement of cardiac silhouette with pulmonary vascular congestion post pacemaker.  9 mm questionable right upper lobe nodular density.  New sclerotic upper thoracic vertebral body approximately T5 highly suspicious for osseous metastatic disease.  CT chest recommended to exclude pulmonary nodule/metastasis.   Electronically Signed   By: Ulyses Southward M.D.   On: 06/29/2013 15:09   Ct Angio Chest Pe W/cm &/or Wo Cm  06/29/2013   CLINICAL DATA:  Short of breath for 1 week. Chest pain for 6 months.  EXAM: CT ANGIOGRAPHY CHEST WITH CONTRAST  TECHNIQUE: Multidetector CT imaging of the chest was performed using the standard protocol during bolus administration of intravenous contrast. Multiplanar CT image reconstructions and MIPs were obtained to evaluate the vascular anatomy.  CONTRAST:  52mL OMNIPAQUE IOHEXOL 350 MG/ML SOLN  COMPARISON:  Current chest radiograph.  FINDINGS: No evidence of a pulmonary embolus.  There are multiple pulmonary nodules. Most are sub cm. Reference measurements were made of several. There is a discrete nodule arising from the apex of the left upper lobe on image 13 measuring 6.2 mm. There is an irregular nodule in the right upper lobe on image 34 measuring 8.5 mm. There is a discrete well-defined nodule in the right lower lobe on image 50 measuring 7.1 mm. There is an elongated nodular opacity in the left upper lobe posteriorly on image 36 measuring 13 mm. There is a focal area of opacity with irregular lobulated margins in the left upper lobe abutting the medial pleural margin. It measures 3.7 cm x 1.8 cm in greatest transverse dimensions. There are small, right greater than left, bilateral effusions. There are areas of coarse reticular opacity mostly in the lung bases likely a combination of scarring and subsegmental atelectasis.  The heart is mildly enlarged. No mediastinal or hilar masses or pathologically enlarged lymph nodes are  seen. There is a moderate to large hiatal hernia.  Below the diaphragm the liver shows morphologic changes of advanced cirrhosis. Irregular hypoattenuation occupies the majority of the visualize right lower lobe and caudate lobe. The spleen is enlarged. There is a possible right adrenal mass. This is more likely fluid adjacent to the normal right adrenal gland.  There is ascites. There is nodularity throughout the visualized omentum and in the peritoneum extending to the herniated peritoneal fat. This is  highly suspicious for peritoneal carcinomatosis.  There is sclerosis throughout the entire T4 vertebrae without loss of vertebral height mild sclerosis is seen in the T3 vertebrae. No osteolytic lesions.  Review of the MIP images confirms the above findings.  IMPRESSION: 1. No evidence of a pulmonary embolus. 2. Findings are highly suspicious for neoplastic/metastatic disease. There are multiple pulmonary nodules. There is abnormal hypoattenuation throughout much of the liver and there is evidence of peritoneal carcinomatosis. There is also a sclerotic T4 vertebrae with some small areas of sclerosis in the T3 vertebrae. Possibilities include metastatic disease from breast carcinoma. Hepatocellular carcinoma may be the primary, given the underlying changes of cirrhosis. 3. There is also cardiomegaly and small, right greater than left, pleural effusions. Cardiac decompensation may be responsible for the shortness of breath.   Electronically Signed   By: Lajean Manes M.D.   On: 06/29/2013 17:24    Microbiology: No results found for this or any previous visit (from the past 240 hour(s)).   Labs: Basic Metabolic Panel:  Recent Labs Lab 06/30/13 0502 07/01/13 0420 07/02/13 0546 07/03/13 0540 07/04/13 0513  NA 139 140 140 139 137  K 4.3 4.4 4.5 4.6 4.4  CL 102 103 103 102 101  CO2 23 24 24 25 23   GLUCOSE 105* 107* 110* 113* 119*  BUN 22 28* 26* 28* 32*  CREATININE 1.12* 1.24* 1.08 1.16* 1.18*   CALCIUM 7.8* 7.9* 7.7* 7.6* 7.8*   Liver Function Tests:  Recent Labs Lab 06/30/13 0502 07/01/13 0420 07/02/13 0546 07/03/13 0540 07/04/13 0513  AST 24 23 24 26 27   ALT 13 13 13 14 14   ALKPHOS 170* 180* 190* 198* 215*  BILITOT 0.6 0.4 0.5 0.5 0.5  PROT 5.5* 5.6* 5.4* 5.4* 5.5*  ALBUMIN 2.5* 2.5* 2.4* 2.4* 2.4*   No results found for this basename: LIPASE, AMYLASE,  in the last 168 hours No results found for this basename: AMMONIA,  in the last 168 hours CBC:  Recent Labs Lab 06/30/13 0502 07/01/13 0420 07/02/13 0546 07/03/13 0540 07/04/13 0513  WBC 13.2* 11.9* 11.7* 14.2* 14.9*  NEUTROABS 10.9* 9.7* 9.6* 11.6* 12.4*  HGB 11.2* 11.3* 11.4* 11.4* 11.7*  HCT 34.7* 34.7* 35.5* 35.1* 35.6*  MCV 82.4 83.0 83.1 83.2 82.2  PLT 241 247 229 226 233   Cardiac Enzymes:  Recent Labs Lab 06/29/13 2147 06/30/13 0335 06/30/13 0925  TROPONINI <0.30 <0.30 <0.30   BNP: BNP (last 3 results)  Recent Labs  06/29/13 1434 07/03/13 0540 07/04/13 0513  PROBNP 2326.0* 1416.0* 1475.0*   CBG: No results found for this basename: GLUCAP,  in the last 168 hours  Additional labs: 1. Prealbumin: 9.7 2. Vitamin D., 25-hydroxy: 55 3. INR: 2.81 4. TSH: 9.880 5. 2-D echo 07/01/13: Study Conclusions  - Left ventricle: The cavity size was normal. There was mild focal basal hypertrophy of the septum. Systolic function was normal. The estimated ejection fraction was in the range of 55% to 60%. Doppler parameters are consistent with pseudonormalleft ventricular relaxation (grade2 diastolic dysfunction). The E/A ratio is 2.2. The MV decel time is 148 msec. The E/e' ratio is >15, suggesting markedly elevated LV filling pressure. - Aortic valve: Sclerosis without stenosis. Trivial regurgitation. - Mitral valve: Mildly thickened leaflets . Mild, late bileaflet end-systolic prolapse. Mild regurgitation. - Left atrium: Severely dilated (45 ml/m2). - Right ventricle: The cavity size was  mildly dilated. The moderator band was prominent. Pacer wire or catheter noted in right ventricle. Systolic function was normal. -  Right atrium: The atrium was mildly dilated (20 cm2). Pacer wire or catheter noted in right atrium. - Atrial septum: Aneurysmal IAS.Cannot exclude PFO. - Tricuspid valve: Moderate regurgitation. - Pulmonary arteries: PA peak pressure: 54mm Hg (S). - Inferior vena cava: The vessel was normal in size; the respirophasic diameter changes were in the normal range (= 50%); findings are consistent with normal central venous pressure. - Pericardium, extracardiac: There was no pericardial effusion.     Signed:  Vernell Leep, MD, FACP, FHM. [facility] Pager [phone]  If 7PM-7AM, please contact night-coverage [email or web address] Password [facility] 07/05/2013, 12:07 PM

## 2013-07-05 NOTE — Progress Notes (Signed)
Attempted to call [facility] at [phone] to give report on pt.  They were not able to connect me with the nurse that was assigned to pt.   The person I spoke to said they needed to get in touch with the SW and that the correct nurse would call me back to get report.

## 2013-07-05 NOTE — Progress Notes (Signed)
ANTICOAGULATION CONSULT NOTE - Follow Up Consult  Pharmacy Consult for Coumadin Indication: Hx Afib  No Known Allergies  Patient Measurements: Height: 5\' 8"  (172.7 cm) Weight: 129 lb 14.4 oz (58.922 kg) IBW/kg (Calculated) : 63.9  Vital Signs: Temp: 98.3 F (36.8 C) (04/02 0500) BP: 115/68 mmHg (04/02 1021) Pulse Rate: 73 (04/02 1021)  Labs:  Recent Labs  07/03/13 0540 07/04/13 0513 07/05/13 0720  HGB 11.4* 11.7*  --   HCT 35.1* 35.6*  --   PLT 226 233  --   LABPROT 28.0* 26.8* 28.6*  INR 2.73* 2.58* 2.81*  CREATININE 1.16* 1.18*  --     Estimated Creatinine Clearance: 26.5 ml/min (by C-G formula based on Cr of 1.18).  Assessment: 80 y.o. F who continues on warfarin for hx Afib. The patient was noted to have doses held on 3/25 and 3/26 for supratherapeutic INR. New home Coumadin dose is 1 mg daily except 2 mg on Tuesday and Saturday. Had been on 2 mg daily except 1 mg on Tuesday and Friday prior to 3/25.   The patient's INR this morning remains therapeutic (INR 2.81 << 2.58, goal of 2-3). No CBC done today. No overt s/sx of bleeding noted.   Goal of Therapy:  INR 2-3 Monitor platelets by anticoagulation protocol: Yes   Plan:  1. Warfarin 1 mg x 1 dose at 1800 today 2. Will continue to monitor for any signs/symptoms of bleeding and will follow up with PT/INR in the a.m.   Alycia Rossetti, PharmD, BCPS Clinical Pharmacist Pager: [phone] 07/05/2013 11:50 AM

## 2013-07-06 NOTE — Progress Notes (Signed)
I have reviewed this case with our NP and agree with the Assessment and Plan as stated.  Shannie Kontos L. Yulissa Needham, MD MBA The Palliative Medicine Team at [facility] Team Phone: [phone] Pager: [phone]   

## 2013-07-07 ENCOUNTER — Encounter: Payer: Self-pay | Admitting: Internal Medicine

## 2013-07-07 ENCOUNTER — Non-Acute Institutional Stay (SKILLED_NURSING_FACILITY): Payer: Medicare Other | Admitting: Internal Medicine

## 2013-07-07 DIAGNOSIS — C799 Secondary malignant neoplasm of unspecified site: Secondary | ICD-10-CM

## 2013-07-07 DIAGNOSIS — I509 Heart failure, unspecified: Secondary | ICD-10-CM

## 2013-07-07 DIAGNOSIS — I10 Essential (primary) hypertension: Secondary | ICD-10-CM

## 2013-07-07 DIAGNOSIS — C801 Malignant (primary) neoplasm, unspecified: Secondary | ICD-10-CM

## 2013-07-07 DIAGNOSIS — I4891 Unspecified atrial fibrillation: Secondary | ICD-10-CM

## 2013-07-07 DIAGNOSIS — N179 Acute kidney failure, unspecified: Secondary | ICD-10-CM

## 2013-07-07 NOTE — Progress Notes (Signed)
Patient ID: Wendy Garner, female   DOB: 1915-11-16, 80 y.o.   MRN: [id number]   This is an acute visit.  Level of care skilled.  Facility [facility].  Chief complaint acute visit status post hospitalization for shortness of breath with history of CHF and metastatic lung disease.  History of present illness.  Patient is a 80 year old female who presented to the ER with worsening shortness of breath for 2 weeks.  Chest x-ray showed CABG changes with mild by a basilar effusions and large cardiac silhouette with pulmonary vascular congestion as well as a questionable right upper lobe density.  BNP was elevated at 2300.  Workup was negative for pulmonary embolism there were multiple pulmonary nodules concerning for metastatic disease as well as liver nodules and T3-T4 vertebral lesions concerning for metastatic disease.  Suspected CHF was treated with IV Lasix although apparently her dyspnea did persist -she does continue on low dose Lasix 20 mg a day.  In regards to the pulmonary metastasis this was discussed with the patient's family with the diagnosis of an overall poor prognosis and it appears the emphasis will be on comfort with no aggressive intervention or investigation of this  She does have a history of leukocytosis at workup in the hospital did not show any evidence of pneumonia or UTI this was thought to be likely chronic reactive from the metastatic disease.  Apparently patient's other medical issues were stable during her hospitalization  She is on Coumadin for a history of atrial fibrillation atrial flutter-continues on metoprolol for rate control    previous medical history.   History of CHF  History of atrial fibrillation atrial flutter.  Coronary artery disease.  Status post CABG Peptic ulcer disease.  Status post cardiac pacemaker.  Acute renal failure apparently resolving.  Anemia of chronic disease.  Metastatic cancer to lungs liver skeletal and  peritoneal.  Protein calorie malnutrition.  Surgical history.  Partial colectomy.  Tonsillectomy.  Cataract extraction.  Cholecystectomy    Family medical social history reviewed per discharge summary on 07/05/2013. Social -patient does not have any history of tobacco or alcohol use  Family history there is history of CVA in her father.    --  Medications have been reviewed per [facility] Calcium carbonate 600 mg daily.  prilosec  20 mg daily.  Lasix 20 mg daily.  Synthroid 88 mcg daily.  Metoprolol extended release 25 mg daily.  Morphine concentrate 10 mg-0.5 mL-give 5 mg every 2 hours when necessary severe pain or shortness of breath.  Multivitamin daily.  Potassium 10 mEq daily.  Coumadin 1 mg daily.  Ambien 5 mg each bedtime when necessary.   Hospital studies.  06/29/2013.  CT of the chest-no evidence of pulmonary embolus-findings suspicious for neoplastic metastatic disease with multiple pulmonary nodules.  07/01/2013.  Cardiac echo-estimated ejection fraction 55-60%  Did show grade 2 diastolic dysfunction.      .  Review of systems. In general no complaints of fever or chills continues to complain of some chronic shortness of breath.  Skin-does not complaining of rash or itching.  Head ears eyes nose mouth and throat-does not complaining of any visual changes or sore throat.  Respiratory-continues to complain of chronic shortness of breath for the past 2 weeks apparently this is not any worse than it was in the hospital per patient-does not complaining of any cough.  Cardiac-no complaints of chest pain no edema.  GU denies any dysuria.  GI-does not complaining of any abdominal discomfort nausea vomiting  diarrhea or constipation.  Muscle skeletal does not complaining of joint pain at this time according to nursing staff has not asked for her pain medication off and again does have widespread metastasis.  Neurologic no complaints of  headache dizziness or syncopal-type feelings.  Psych-is not complaining of depression or anxiety  .  Physical exam.  Temperature 97.0 pulse 57 respirations 16 blood pressure 100/68 O2 saturations have been in the 90s weight appears to be stable in the low 130s most recently 132  In general this is a frail elderly female very pleasant in no distress.  Her skin is warm and dry.  Eyes pupils appear equal round reactive to light sclera and conjunctiva are clear visual acuity appears intact.  Oropharynx clear mucous membranes moist.  Heart is irregular irregular rate and rhythm without murmur gallop or rub  She does not have any lower extremity edema   Chest is clear to auscultation there is no labored breathing somewhat shallow air entry at the bases.  Abdomen is soft mildly tender to diffuse palpation this does not appear to be acutely painful bowel sounds are positive.  GU could not appreciate suprapubic distention again some discomfort with the invasive maneuver.  Muscle skeletal general frailty  moves all extremities at baseline ambulates in a wheelchair.  She has arthritic changes diffuse.  Neurologic grossly intact her speech is clear no lateralizing findings.  Psych she is alert and oriented x3 pleasant and appropriate  .  Labs.   07/07/2013.  INR-2.82   07/06/2013.  WBC 16.6 hemoglobin 11.5 platelets 260 granulocyte percentage elevated at 84 as well as absolute Gran 13.9  Sodium 138 potassium 5.3 BUN 39 creatinine 1.56.  Assessment and plan.  #3-[id number] of diastolic CHF-she is on Lasix potassium however it appears her creatinine is increasing-at this point will hold Lasix until we obtain labs tomorrow morning-if creatinine is down  suspect we will restart this however I would like to keep an eye on this----also will hold potassium for now..  Clinically she appears fairly stable in this regard.  #2-history of metastatic cancer-again the emphasis is on  comfort care at this point she appears to be comfortable-no aggressive workup apparently is desired. She reiterated this again during my visit today--  #3 history of atrial fibrillation this is rate controlled on Metroprolol INRs therapeutic Will recheck this tomorrow as well.  #4-hypertension --at this point continue current medications this appears to be relatively stable although systolics are somewhat borderline low  At times I suspect holding Lasix will help with this as well  Anemia of chronic disease hemoglobin appears to be fairly stable continue to monitor Will update this next week.  #7 history of hypothyroidism she is on Synthroid--TSH per chart review was elevated in the hospital at over 9 -- recommendation to recheck this in 4 weeks--.  #8-history of peptic ulcer disease she is on a PPI.  #9 as history of severe protein calorie malnutrition again she will have to be assessed by dietary and by mouth intake certainly encouraged.--She is on supplement  [id number]-of note greater than 35 minutes spent assessing patient-reviewing her hospital records-discussing her status with nursing staff-in coordinating and formulating a plan of care for numerous diagnoses-of note greater than 50% of time spent coordinating plan of care

## 2013-07-08 LAB — VITAMIN D 1,25 DIHYDROXY
Vitamin D 1, 25 (OH)2 Total: 53 pg/mL (ref 18–72)
Vitamin D2 1, 25 (OH)2: <8 pg/mL
Vitamin D3 1, 25 (OH)2: 53 pg/mL

## 2013-07-10 ENCOUNTER — Encounter (HOSPITAL_COMMUNITY): Payer: Self-pay | Admitting: Emergency Medicine

## 2013-07-10 ENCOUNTER — Emergency Department (HOSPITAL_COMMUNITY)
Admission: EM | Admit: 2013-07-10 | Discharge: 2013-07-11 | Discharge status: Against Medical Advice | Payer: Medicare Other | Attending: Emergency Medicine | Admitting: Emergency Medicine

## 2013-07-10 ENCOUNTER — Inpatient Hospital Stay (HOSPITAL_COMMUNITY)
Admit: 2013-07-10 | Discharge: 2013-07-10 | Disposition: A | Discharge status: Home / Self Care | Payer: Medicare Other | Attending: Internal Medicine | Admitting: Internal Medicine

## 2013-07-10 ENCOUNTER — Non-Acute Institutional Stay (SKILLED_NURSING_FACILITY): Payer: Medicare Other | Admitting: Internal Medicine

## 2013-07-10 DIAGNOSIS — R140 Abdominal distension (gaseous): Secondary | ICD-10-CM

## 2013-07-10 DIAGNOSIS — J189 Pneumonia, unspecified organism: Secondary | ICD-10-CM

## 2013-07-10 DIAGNOSIS — N179 Acute kidney failure, unspecified: Secondary | ICD-10-CM | POA: Insufficient documentation

## 2013-07-10 DIAGNOSIS — E875 Hyperkalemia: Secondary | ICD-10-CM

## 2013-07-10 DIAGNOSIS — Z8669 Personal history of other diseases of the nervous system and sense organs: Secondary | ICD-10-CM | POA: Insufficient documentation

## 2013-07-10 DIAGNOSIS — Z8711 Personal history of peptic ulcer disease: Secondary | ICD-10-CM | POA: Insufficient documentation

## 2013-07-10 DIAGNOSIS — R141 Gas pain: Secondary | ICD-10-CM

## 2013-07-10 DIAGNOSIS — I4891 Unspecified atrial fibrillation: Secondary | ICD-10-CM | POA: Insufficient documentation

## 2013-07-10 DIAGNOSIS — R142 Eructation: Secondary | ICD-10-CM

## 2013-07-10 DIAGNOSIS — Z860100 Personal history of colon polyps, unspecified: Secondary | ICD-10-CM | POA: Insufficient documentation

## 2013-07-10 DIAGNOSIS — F0390 Unspecified dementia without behavioral disturbance: Secondary | ICD-10-CM | POA: Insufficient documentation

## 2013-07-10 DIAGNOSIS — R143 Flatulence: Secondary | ICD-10-CM

## 2013-07-10 DIAGNOSIS — N289 Disorder of kidney and ureter, unspecified: Secondary | ICD-10-CM

## 2013-07-10 DIAGNOSIS — Z7901 Long term (current) use of anticoagulants: Secondary | ICD-10-CM | POA: Insufficient documentation

## 2013-07-10 DIAGNOSIS — Z79899 Other long term (current) drug therapy: Secondary | ICD-10-CM | POA: Insufficient documentation

## 2013-07-10 DIAGNOSIS — M1990 Unspecified osteoarthritis, unspecified site: Secondary | ICD-10-CM | POA: Insufficient documentation

## 2013-07-10 DIAGNOSIS — J159 Unspecified bacterial pneumonia: Secondary | ICD-10-CM | POA: Insufficient documentation

## 2013-07-10 HISTORY — DX: Unspecified dementia, unspecified severity, without behavioral disturbance, psychotic disturbance, mood disturbance, and anxiety: F03.90

## 2013-07-10 NOTE — Progress Notes (Signed)
Patient ID: Wendy Garner, female   DOB: 1915-09-13, 80 y.o.   MRN: [id number]   This is an acute visit.  Level of care skilled.  Facility [facility].  Chief complaint-acute visit  followup renal insufficiency mild hyperkalemia-.  History of present illness.  Patient is a pleasant elderly resident who was hospitalized for shortness of breath thought multifactoral secondary to CHF and metastatic lung disease-he was treated with IV Lasix and apparently this was stabilized as she does have numerous pulmonary metastasis and family has opted for conservative treatment no aggressive workup.  We have done labs here somewhat significant for an what appears to be an elevated creatinine from baseline on 4-5 -15 noted be 1.67 with a potassium of 5.4  Appears her baseline in the hospital was more in the 1-1.2 range her Lasix which was low dose has been held as well as her potassium.  She also has a chronically elevated white count was recently noted on April 5 to be 19.9 which is up slightly from 16 earlier.  She has been afebrile--- however she did have one episode of vomiting apparently earlier this evening-vital signs appeared to be relatively stable.  She has no complaints tonight although again she is pretty sleepy the hour  is quite late which could explain this she also did receive a dose of Phenergan and apparently received Ambien as well after asking for it from the nurse  Family medical social history has been reviewed per discharge note on 07/05/2013.  Medications at been reviewed per [facility].  Review of systems.  Somewhat limited secondary to patient's pretty sleepy but she is not complaining of any pain shortness of breath or chest pain or acute abdominal discomfort she had one episode of vomiting, however  To exam.  Temperature is 97.7 pulse 72 respirations 20 blood pressure 94/64 O2 saturation 95% room air.  In general this is a somewhat frail elderly female in no distress she does  appear to be somewhat sleepy but responsive.  Her skin is warm and dry.  Chest is clear to auscultation with somewhat poor respiratory effort.  Heart is regular rate and rhythm without murmur gallop or rub.  Abdomen does appear to be somewhat distended it is soft with positive bowel sounds there is some tenderness to palpation although I would not classify this is acute tenderness and this is diffuse.  She does not appear to have significant lower extremity edema.  Neurologic again she is sleepy but appears to be at her baseline neurologically cranial nerves intact speech is clear.  Labs.  07/08/2013.  Sodium 135 potassium 5.4 BUN 50 creatinine 1.67 calcium 7.7.  The WBC 19.9 hemoglobin 12.5 platelets 264.  Absolute grands elevated at 16.7.  Assessment and plan.  #1-history of vomiting with what appears to be some abdominal distention-Will obtain an x-ray of the area -- also order stat CBC with differential and comprhensive metabolic panel monitor vital signs pulse ox every 4 hours Per nursing staff last recorded bowel movement was approximately 2 days ago-    history of renal insufficiency--- creatinine appears to have crept up-- will update the lab--  her Lasix and potassium are on hold  [phone]

## 2013-07-10 NOTE — ED Notes (Signed)
Pt to department from [facility].  Pt sent over due to abnormal labs; WBC 23.4, Creatine 2.74.

## 2013-07-11 ENCOUNTER — Non-Acute Institutional Stay (SKILLED_NURSING_FACILITY): Payer: Medicare Other | Admitting: Internal Medicine

## 2013-07-11 ENCOUNTER — Emergency Department (HOSPITAL_COMMUNITY): Payer: Medicare Other

## 2013-07-11 DIAGNOSIS — C801 Malignant (primary) neoplasm, unspecified: Secondary | ICD-10-CM

## 2013-07-11 DIAGNOSIS — N179 Acute kidney failure, unspecified: Secondary | ICD-10-CM

## 2013-07-11 DIAGNOSIS — C799 Secondary malignant neoplasm of unspecified site: Secondary | ICD-10-CM

## 2013-07-11 DIAGNOSIS — D72829 Elevated white blood cell count, unspecified: Secondary | ICD-10-CM

## 2013-07-11 LAB — PROTIME-INR
INR: 1.97 — AB (ref 0.00–1.49)
PROTHROMBIN TIME: 21.8 s — AB (ref 11.6–15.2)

## 2013-07-11 LAB — URINALYSIS, ROUTINE W REFLEX MICROSCOPIC
Glucose, UA: NEGATIVE mg/dL
HGB URINE DIPSTICK: NEGATIVE
KETONES UR: NEGATIVE mg/dL
Leukocytes, UA: NEGATIVE
Nitrite: NEGATIVE
Protein, ur: NEGATIVE mg/dL
UROBILINOGEN UA: 0.2 mg/dL (ref 0.0–1.0)
pH: 5.5 (ref 5.0–8.0)

## 2013-07-11 LAB — COMPREHENSIVE METABOLIC PANEL
ALT: 18 U/L (ref 0–35)
AST: 29 U/L (ref 0–37)
Albumin: 2.6 g/dL — ABNORMAL LOW (ref 3.5–5.2)
Alkaline Phosphatase: 299 U/L — ABNORMAL HIGH (ref 39–117)
BUN: 74 mg/dL — AB (ref 6–23)
CALCIUM: 8.1 mg/dL — AB (ref 8.4–10.5)
CO2: 23 mEq/L (ref 19–32)
Chloride: 95 mEq/L — ABNORMAL LOW (ref 96–112)
Creatinine, Ser: 2.79 mg/dL — ABNORMAL HIGH (ref 0.50–1.10)
GFR, EST AFRICAN AMERICAN: 16 mL/min — AB (ref >90)
GFR, EST NON AFRICAN AMERICAN: 13 mL/min — AB (ref >90)
Glucose, Bld: 156 mg/dL — ABNORMAL HIGH (ref 70–99)
Potassium: 6.2 mEq/L — ABNORMAL HIGH (ref 3.7–5.3)
Sodium: 134 mEq/L — ABNORMAL LOW (ref 137–147)
Total Bilirubin: 0.7 mg/dL (ref 0.3–1.2)
Total Protein: 6.1 g/dL (ref 6.0–8.3)

## 2013-07-11 LAB — CBC
HEMATOCRIT: 37.8 % (ref 36.0–46.0)
Hemoglobin: 12.1 g/dL (ref 12.0–15.0)
MCH: 26.4 pg (ref 26.0–34.0)
MCHC: 32 g/dL (ref 30.0–36.0)
MCV: 82.4 fL (ref 78.0–100.0)
PLATELETS: 240 10*3/uL (ref 150–400)
RBC: 4.59 MIL/uL (ref 3.87–5.11)
RDW: 15.5 % (ref 11.5–15.5)
WBC: 22.8 10*3/uL — ABNORMAL HIGH (ref 4.0–10.5)

## 2013-07-11 LAB — LACTIC ACID, PLASMA: LACTIC ACID, VENOUS: 1.8 mmol/L (ref 0.5–2.2)

## 2013-07-11 MED ORDER — SODIUM CHLORIDE 0.9 % IV SOLN: 1.0000 g | Freq: Once | INTRAVENOUS | Status: DC

## 2013-07-11 MED ORDER — INSULIN ASPART 100 UNIT/ML IV SOLN: 8.0000 [IU] | Freq: Once | INTRAVENOUS | Status: DC

## 2013-07-11 MED ORDER — SODIUM CHLORIDE 0.9 % IV SOLN
INTRAVENOUS | Status: DC
  Administered 2013-07-11: 1000 mL via INTRAVENOUS

## 2013-07-11 MED ORDER — SODIUM POLYSTYRENE SULFONATE 15 GM/60ML PO SUSP: 30.0000 g | Freq: Once | ORAL | Status: DC

## 2013-07-11 MED ORDER — DEXTROSE 50 % IV SOLN: 1.0000 | Freq: Once | INTRAVENOUS | Status: DC

## 2013-07-11 MED ORDER — PIPERACILLIN-TAZOBACTAM 3.375 G IVPB
3.3750 g | Freq: Once | INTRAVENOUS | Status: AC
  Administered 2013-07-11: 3.375 g via INTRAVENOUS
  Filled 2013-07-11: qty 50

## 2013-07-11 MED ORDER — VANCOMYCIN HCL IN DEXTROSE 1-5 GM/200ML-% IV SOLN
1000.0000 mg | Freq: Once | INTRAVENOUS | Status: AC
  Administered 2013-07-11: 1000 mg via INTRAVENOUS
  Filled 2013-07-11: qty 200

## 2013-07-11 NOTE — ED Notes (Signed)
Pt's daughter,Wendy Garner is here and pt's son Wendy Garner is on phone. Both are pt's POA. Both are in agreement that the pt. Does not want life saving measures and does not want admission to the hospital. Dr. Marnette Burgess has been with [name] and explained the need for lowering her potassium level immediately and that not doing so would be life threatening. Both [name] and [name] decline any further intervention. After much discussion plan is to finish antibiotics and [name] will sign the pt out AMA and return her to [facility].

## 2013-07-11 NOTE — ED Provider Notes (Signed)
CSN: [id number]     Arrival date & time 07/10/13  2353 History  This chart was scribed for Teressa Lower, MD by Maree Erie, ED Scribe. The patient was seen in room APA06/APA06. Patient's care was started at 12:12 AM.    Chief Complaint  Patient presents with  . Abnormal Lab      Patient is a 80 y.o. female presenting with general illness. The history is provided by a relative. No language interpreter was used.  Illness Location:  Generalized Quality:  No pain Severity:  Severe Duration:  1 day Timing:  Constant Progression:  Unchanged Chronicity:  New Context:  WBC of 23.4 and Creatinine 2.74 Relieved by:  Nothing tried, sent to ED Associated symptoms: fatigue and vomiting     HPI Comments: CHARMELLE SOH is a 80 y.o. female who presents to the Emergency Department from the [facility] due to abnormal lab results. Per nursing note the patient's WBC was 23.4 and Creatine was 2.74. The patient's daughter denies knowing of any recent illness. She does not think the patient is currently on antibiotics. She was at home a week ago but was then admitted to the hospital for generalized weakness for three days. The daughter states the patient was vomiting at the nursing home but does not know how many episodes.     Past Medical History  Diagnosis Date  . Macular degeneration   . Peptic ulcer disease     with history of upper GI bleeding in 2004  . Degenerative joint disease     hip and lumbosacral spine  . Adenocarcinoma of colon   . Sick sinus syndrome     Paroxysmal atrial fibrillation with a rapid ventricular response;   . Atrial fibrillation   . Bradycardia, severe sinus     Extreme; junctional bradycardia and sinus arrest up to 4 seconds  . Dementia    Past Surgical History  Procedure Laterality Date  . Cataract extraction    . Cholecystectomy    . Tonsillectomy    . Partial colectomy  2002    Partial colectomy for carcinoma of the colon-2002   Family History   Problem Relation Age of Onset  . Stroke Father    History  Substance Use Topics  . Smoking status: Never Smoker   . Smokeless tobacco: Never Used  . Alcohol Use: No   OB History   Grav Para Term Preterm Abortions TAB SAB Ect Mult Living                 Review of Systems  Constitutional: Positive for appetite change and fatigue.  Gastrointestinal: Positive for vomiting.  All other systems reviewed and are negative.      Allergies  Review of patient's allergies indicates no known allergies.  Home Medications   Current Outpatient Rx  Name  Route  Sig  Dispense  Refill  . calcium carbonate (OS-CAL) 600 MG TABS   Oral   Take 600 mg by mouth daily.          Marland Kitchen esomeprazole (NEXIUM) 20 MG capsule   Oral   Take 20 mg by mouth daily at 12 noon.         . feeding supplement, ENSURE COMPLETE, (ENSURE COMPLETE) LIQD   Oral   Take 237 mLs by mouth 2 (two) times daily between meals.         Marland Kitchen levothyroxine (SYNTHROID, LEVOTHROID) 88 MCG tablet   Oral   Take 88 mcg by mouth daily.         Marland Kitchen  metoprolol succinate (TOPROL-XL) 25 MG 24 hr tablet   Oral   Take 1 tablet (25 mg total) by mouth daily.   30 tablet   6   . Morphine Sulfate (MORPHINE CONCENTRATE) 10 mg / 0.5 ml concentrated solution   Oral   Take 0.25 mLs (5 mg total) by mouth every 2 (two) hours as needed for severe pain or shortness of breath.   15 mL   0   . Multiple Vitamins-Minerals (CENTRUM SILVER ULTRA WOMENS PO)   Oral   Take 1 tablet by mouth daily.           . multivitamin-lutein (OCUVITE-LUTEIN) CAPS   Oral   Take 1 capsule by mouth daily.           Marland Kitchen omeprazole (PRILOSEC) 20 MG capsule   Oral   Take 20 mg by mouth daily.         . promethazine (PHENERGAN) 12.5 MG tablet   Oral   Take 12.5 mg by mouth every 6 (six) hours as needed for nausea or vomiting.         . warfarin (COUMADIN) 1 MG tablet   Oral   Take 1 tablet (1 mg total) by mouth daily at 6 PM.         .  zolpidem (AMBIEN) 5 MG tablet   Oral   Take 1 tablet (5 mg total) by mouth at bedtime as needed for sleep.   10 tablet   0    BP 109/60  Pulse 84  Temp(Src) 99.2 F (37.3 C) (Rectal)  Resp 17  SpO2 95% Physical Exam  Nursing note and vitals reviewed. Constitutional: She appears well-developed. No distress.  HENT:  Head: Normocephalic and atraumatic.  Mouth/Throat: Mucous membranes are dry. No oropharyngeal exudate.  Eyes: EOM are normal.  Neck: Neck supple.  Cardiovascular: Normal rate and regular rhythm.   Pulmonary/Chest: Effort normal and breath sounds normal. No stridor. No respiratory distress. She has no wheezes. She has no rales.  Abdominal: Soft. She exhibits no distension. There is tenderness. There is no rebound and no guarding.  There is some mild diffuse tenderness without guarding or rebound.   Musculoskeletal: She exhibits no edema and no tenderness.  Calves are non tender.   Neurological:  She is awake and follows commands. She is oriented to self. Provides minimal history.   Skin: Skin is warm and dry.  Psychiatric: She has a normal mood and affect.    ED Course  Procedures (including critical care time)  DIAGNOSTIC STUDIES: Oxygen Saturation is 95% on room air, adequate by my interpretation.    COORDINATION OF CARE: 12:23 AM - Patient's daughter verbalizes understanding and agrees with treatment plan.  Labs Review Labs Reviewed  CBC - Abnormal; Notable for the following:    WBC 22.8 (*)    All other components within normal limits  COMPREHENSIVE METABOLIC PANEL - Abnormal; Notable for the following:    Sodium 134 (*)    Potassium 6.2 (*)    Chloride 95 (*)    Glucose, Bld 156 (*)    BUN 74 (*)    Creatinine, Ser 2.79 (*)    Calcium 8.1 (*)    Albumin 2.6 (*)    Alkaline Phosphatase 299 (*)    GFR calc non Af Amer 13 (*)    GFR calc Af Amer 16 (*)    All other components within normal limits  PROTIME-INR - Abnormal; Notable for the  following:  Prothrombin Time 21.8 (*)    INR 1.97 (*)    All other components within normal limits  URINALYSIS, ROUTINE W REFLEX MICROSCOPIC - Abnormal; Notable for the following:    Specific Gravity, Urine >1.030 (*)    Bilirubin Urine SMALL (*)    All other components within normal limits  LACTIC ACID, PLASMA  URINALYSIS, ROUTINE W REFLEX MICROSCOPIC  I-STAT CHEM 8, ED   Imaging Review Dg Chest 1 View  07/10/2013   CLINICAL DATA:  Abdominal pain, vomiting and weakness. Leukocytosis.  EXAM: CHEST - 1 VIEW  COMPARISON:  Chest radiograph and CTA of the chest performed 06/29/2013  FINDINGS: The lungs are slightly less well expanded. Bibasilar airspace opacification is noted, new from the prior study. This could reflect bibasilar pneumonia. Small bilateral pleural effusions are suspected. No pneumothorax is seen.  The cardiomediastinal silhouette is mildly enlarged. A pacemaker is noted overlying the left chest wall, with leads ending overlying the right atrium and right ventricle. No acute osseous abnormalities are seen. Clips are noted within the right upper quadrant, reflecting prior cholecystectomy.  IMPRESSION: 1. Bibasilar airspace opacification, new from the prior study. This could reflect bibasilar pneumonia. Small bilateral pleural effusions suspected. 2. Mild cardiomegaly noted.   Electronically Signed   By: Roanna Raider M.D.   On: 07/10/2013 23:28   Dg Abd 1 View  07/10/2013   CLINICAL DATA:  Abdominal pain, vomiting and weakness. Leukocytosis.  EXAM: ABDOMEN - 1 VIEW  COMPARISON:  Lumbar spine radiograph performed 11/27/2003  FINDINGS: The visualized bowel gas pattern is unremarkable. Scattered air and stool filled loops of colon are seen; no abnormal dilatation of small bowel loops is seen to suggest small bowel obstruction. No free intra-abdominal air is identified, though evaluation for free air is limited on supine views.  Diffuse sclerotic change is noted at the right femoral head,  neck and greater femoral trochanter, of uncertain significance and new from 2005; the sacroiliac joints are unremarkable in appearance. The visualized lung bases are grossly clear. Clips are noted within the right upper quadrant, reflecting prior cholecystectomy. Pacemaker leads are partially imaged. Scattered vascular calcifications are seen.  IMPRESSION: 1. Unremarkable bowel gas pattern; no free intra-abdominal air seen. 2. Apparent large sclerotic lesions noted at the right femoral head, neck and greater femoral trochanter. These are new from 2005. Would correlate with history of metastatic disease, and consider bone scan for further evaluation.   Electronically Signed   By: Roanna Raider M.D.   On: 07/10/2013 23:25   IV ABx for HCAP. IV fluids provided.   Lab delay due to EMR scheduled downtime.  Potassium elevated and I ordered Calcium, D50, insulin and Kayexalte.  PT's Daughter bedside is POA, has DNR paperwork that states no CPR, IVFs and hospitalization acceptable.  Daughter feels that patient would not want treatment of her potassium.  I spent a very long time talking with her, recommending medications, and daughter wishes to take her back to [facility] AMA.  PT unable to state what her wishes are. I spoke to hospitalist on call, no beds available at [facility] tonight, if she was admitted, would require transfer to [facility] and daughter is absolutely against this.   PCP at [facility] consulted.   2:56 AM I discussed with DR Chilton Si, on call for [facility] - he is aware of labs, diagnosis, and daughters wishes as above.  PT will be transferred by daughter back to ECF.   Despite multiple conversations with patient's daughter, she took  her back to her nursing home AMA.  MDM   Dx: HCAP, ARF, hyperkalemia  Transferred to the emergency department from nursing home for abnormal labs - nursing note states elevated white blood cell count and elevated creatinine.  I was unable to view these particular labs in EMR and  labs were repeated. I was able to view imaging obtained from earlier today as above. Antibiotics given. IV fluids given. Repeat blood work in the ER demonstrates hyperkalemia 6.2 with elevated creatinine. Patient's daughter and power of attorney declines treatment of potassium. I reviewed patient's DO NOT RESUSCITATE paperwork. Daughter is adamant that she would not want treatment. A consult with hospitalist Dr. Arnoldo Morale. I was able to notify primary care physician. Patient's daughter brought her back to [place] [facility].   I personally performed the services described in this documentation, which was scribed in my presence. The recorded information has been reviewed and is accurate.    Teressa Lower, MD 07/11/13 [phone]

## 2013-07-11 NOTE — Progress Notes (Signed)
Patient ID: Wendy Garner, female   DOB: 28-Sep-1915, 80 y.o.   MRN: [id number] Facility; [facility] Chief complaint; review status post stay at [facility] 3/27 through 4/2. review post a trip to the emergency room last night. History; Wendy Garner is an elderly woman who was living at home with assistance from her family however really still quite functional up until late in March. She was admitted to hospital with increasing weakness shortness of breath and ill at inability to function at a previous level including performing her ADLs. In the hospital cardiac enzymes were negative her echo showed an EF of 55%. She was felt to be in heart failure she was given IV Lasix in her seat HF was felt to be compensated. She had a CT scan of the chest looking for pulmonary embolism which was not shown however she was found to be suffering from innumerable pulmonary metastasis liver metastasis and metastasis involving her thoracic spine as well as peritoneal carcinomatosis. She was sent here for ongoing care. On 4/7 lab showed a white count of 23.4 with 92% granulocytes. Her sodium was 134 potassium 6.2 BUN 75 creatinine 2.74 her albumin was 2.6 calcium 8.1 she was sent to the emergency room because of this and acute renal failure. I gather the family had already come around to the idea of comfort measures and she was returned to the facility.  Physical examination Gen. very frail woman somewhat confused but able to answer some questions. HEENT dry mucous membranes Respiratory clear entry bilaterally there is no digital clubbing Breasts no masses noted Cardiac midsystolic blowing blowing murmur at the lower left sternal border she appears to be volume contracted.  Abdomen is distended and diffusely tender. There is a cholecystectomy scar and a midline scar for a partial colectomy she had for cancer in 2002. I suspect she has some ascites. GU some degree of urinary retention bladder scan shown greater than 500  cc  Impression/plan #1 acute renal failure likely secondary to prerenal and postrenal factors #2 leukocytosis with a marked left shift the. This could be underlying infection either the usual sources so urine lung or perhaps bacterial peritonitis. Is also could be due to the underlying tumor burden and sulfa. #3 CT scan of the abdomen listed hepatic cirrhosis I don't see any additional information on this #4 widely metastatic cancer of unknown primary.  Clearly this woman is failing. The acute renal insufficiency is the major issue. I discussed this with both her son and daughter who are at the bedside. They want her made comfortable the. Towards this end I have given her routine Roxanol, placed a Foley catheter. She will have no hospitalization no labs no further vital signs. For now they do not appear to wish hospice involvement. He is on full comfort measures and the facility

## 2013-07-23 NOTE — Consult Note (Signed)
I have reviewed and discussed the care of this patient in detail with the nurse practitioner including pertinent patient records, physical exam findings and data. I agree with details of this encounter.  

## 2013-08-03 DEATH — deceased

## 2015-02-12 IMAGING — CR DG CHEST 2V
2 series · 2 of 2 positions shown · non-contrast
Comparison: 09/03/2010

CLINICAL DATA: Shortness of breath for 2 weeks worse today,
numbness in hands and feet, exhaustion, pacemaker, atrial
fibrillation, on blood thinners, history colon cancer

EXAM:
CHEST  2 VIEW

[w chest pa]
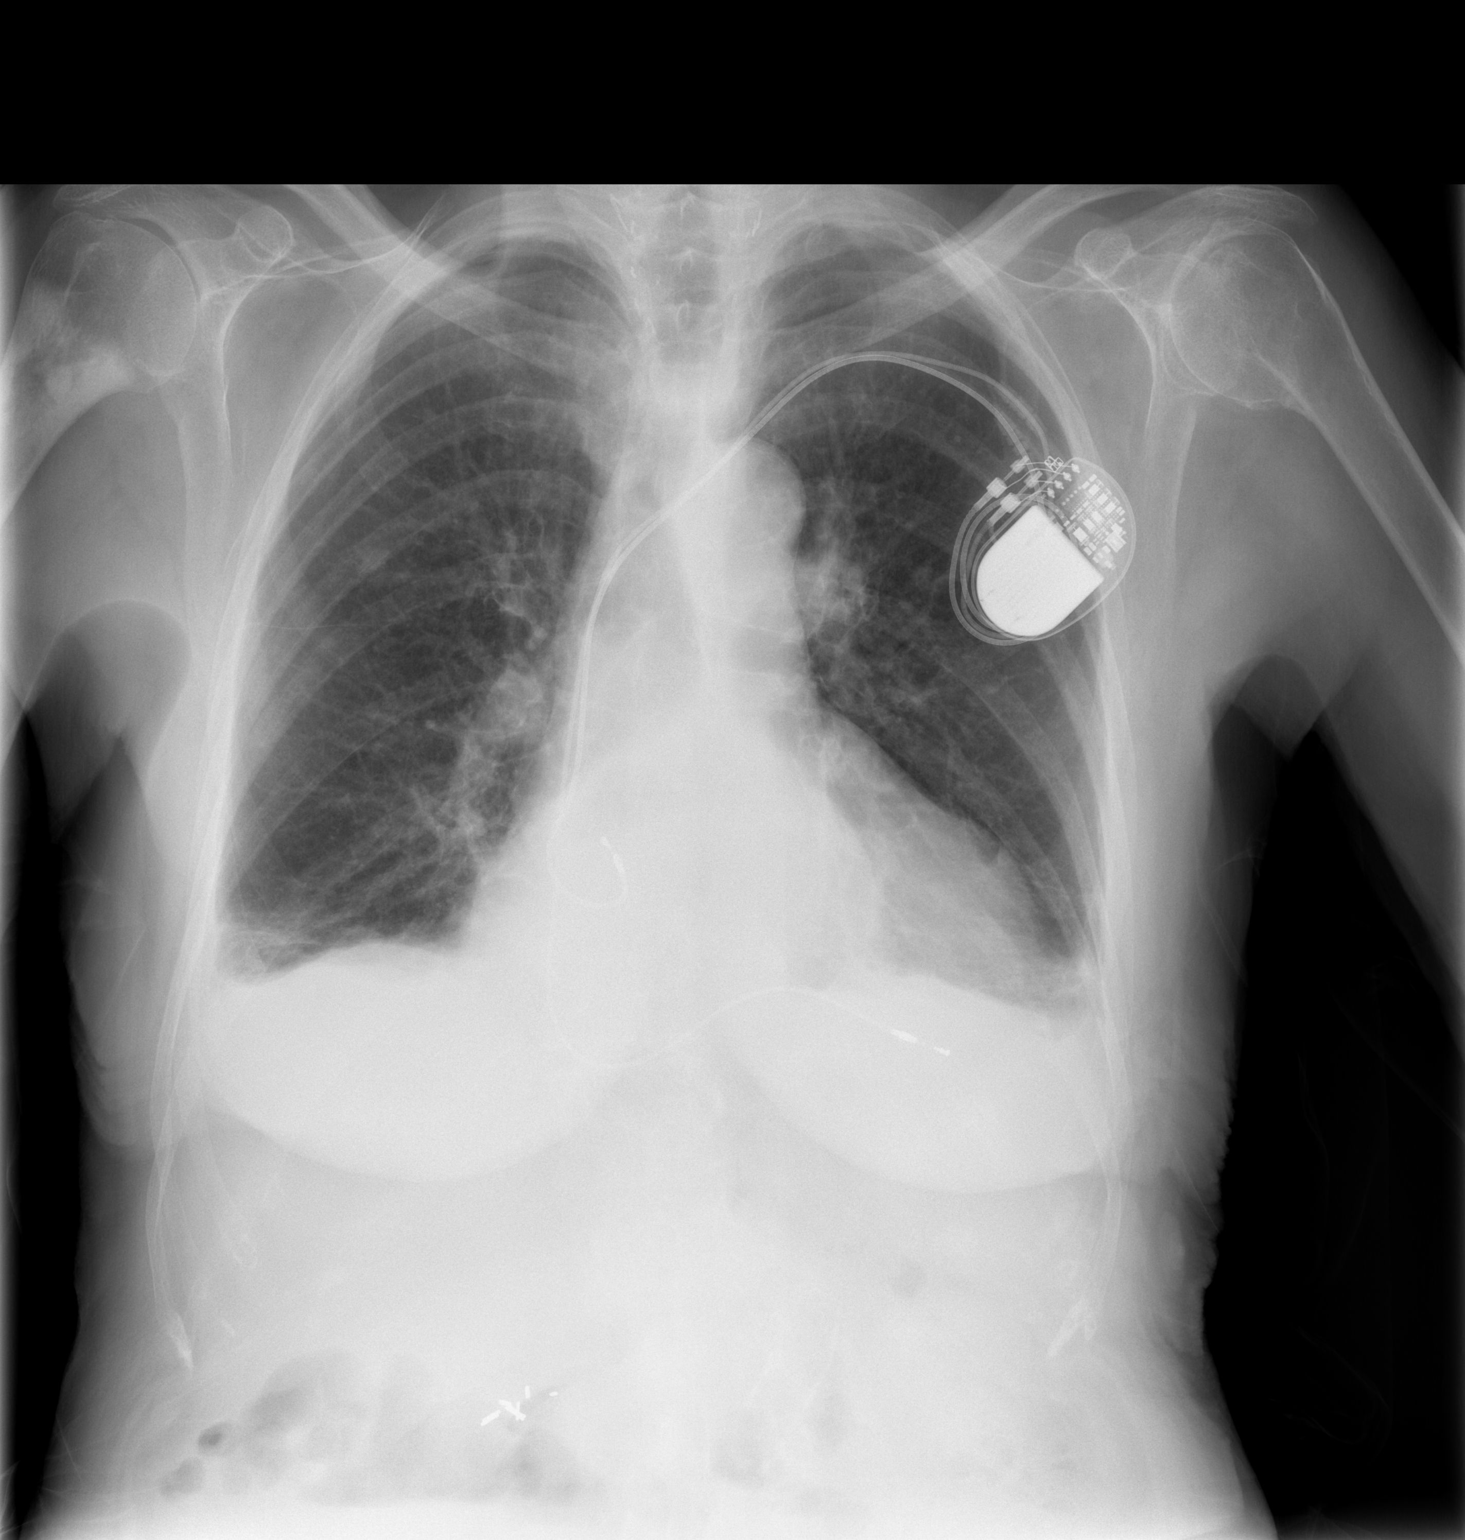

[w chest lat]
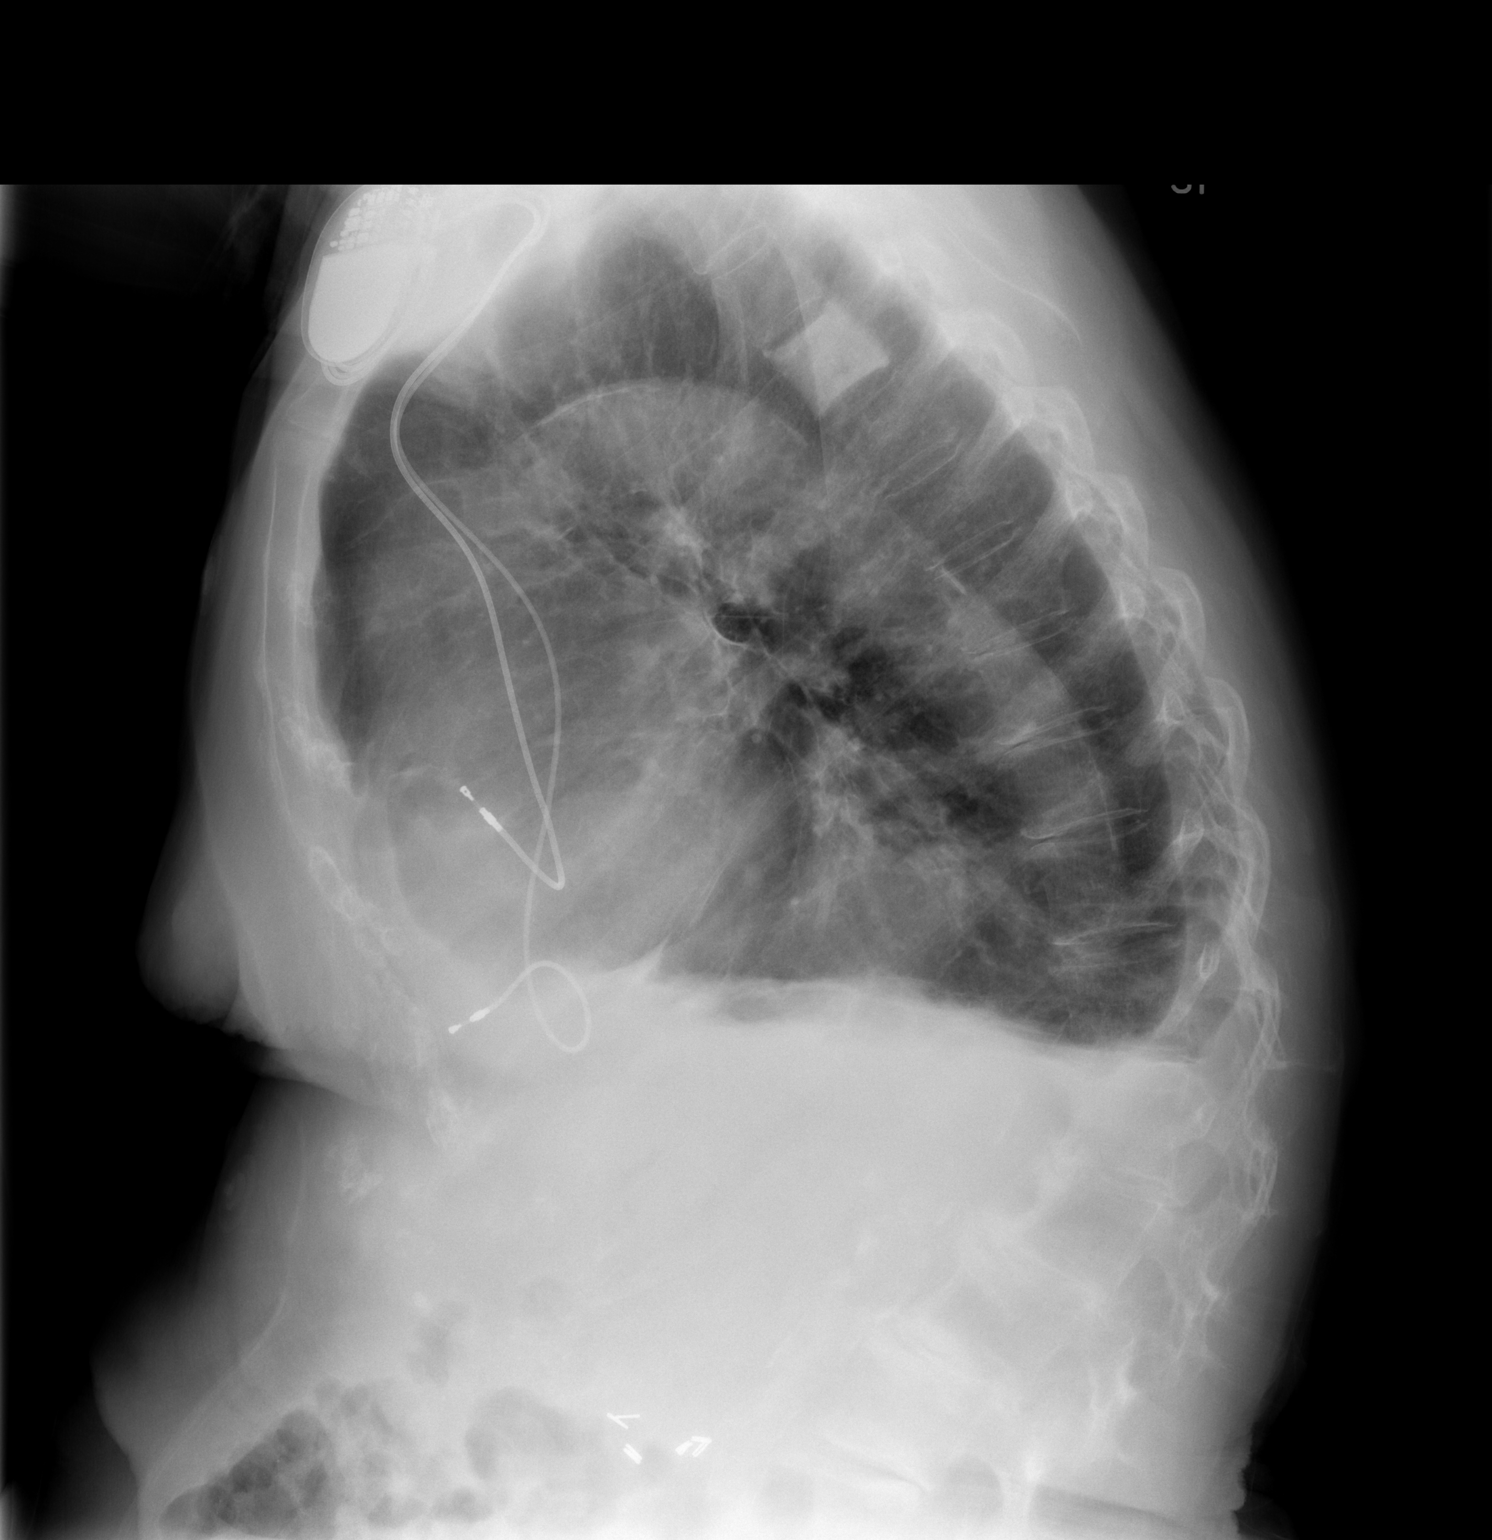

[2 of 2 positions shown; findings below may reference images not displayed]

FINDINGS: Left subclavian transvenous pacemaker leads project at right atrium
and right ventricle, stable.

Enlargement of cardiac silhouette with pulmonary vascular
congestion.

Calcification aortic arch.

Large hiatal hernia.

Emphysematous changes with bibasilar effusions and atelectasis.

Questionable nodular density right upper lobe 9 mm diameter.

No definite infiltrate or pneumothorax.

New sclerotic upper thoracic vertebra proximally T5 highly worrisome
for metastatic disease.

Degenerative disc disease changes at the thoracolumbar spine.
IMPRESSION: COPD changes with bibasilar effusions and atelectasis.

Enlargement of cardiac silhouette with pulmonary vascular congestion
post pacemaker.

9 mm questionable right upper lobe nodular density.

New sclerotic upper thoracic vertebral body approximately T5 highly
suspicious for osseous metastatic disease.

CT chest recommended to exclude pulmonary nodule/metastasis.

## 2015-02-23 IMAGING — CR DG CHEST 1V
1 series · 1 of 1 positions shown · non-contrast
Comparison: Chest radiograph and CTA of the chest performed
06/29/2013

CLINICAL DATA: Abdominal pain, vomiting and weakness. Leukocytosis.

EXAM:
CHEST - 1 VIEW

[view not recorded]
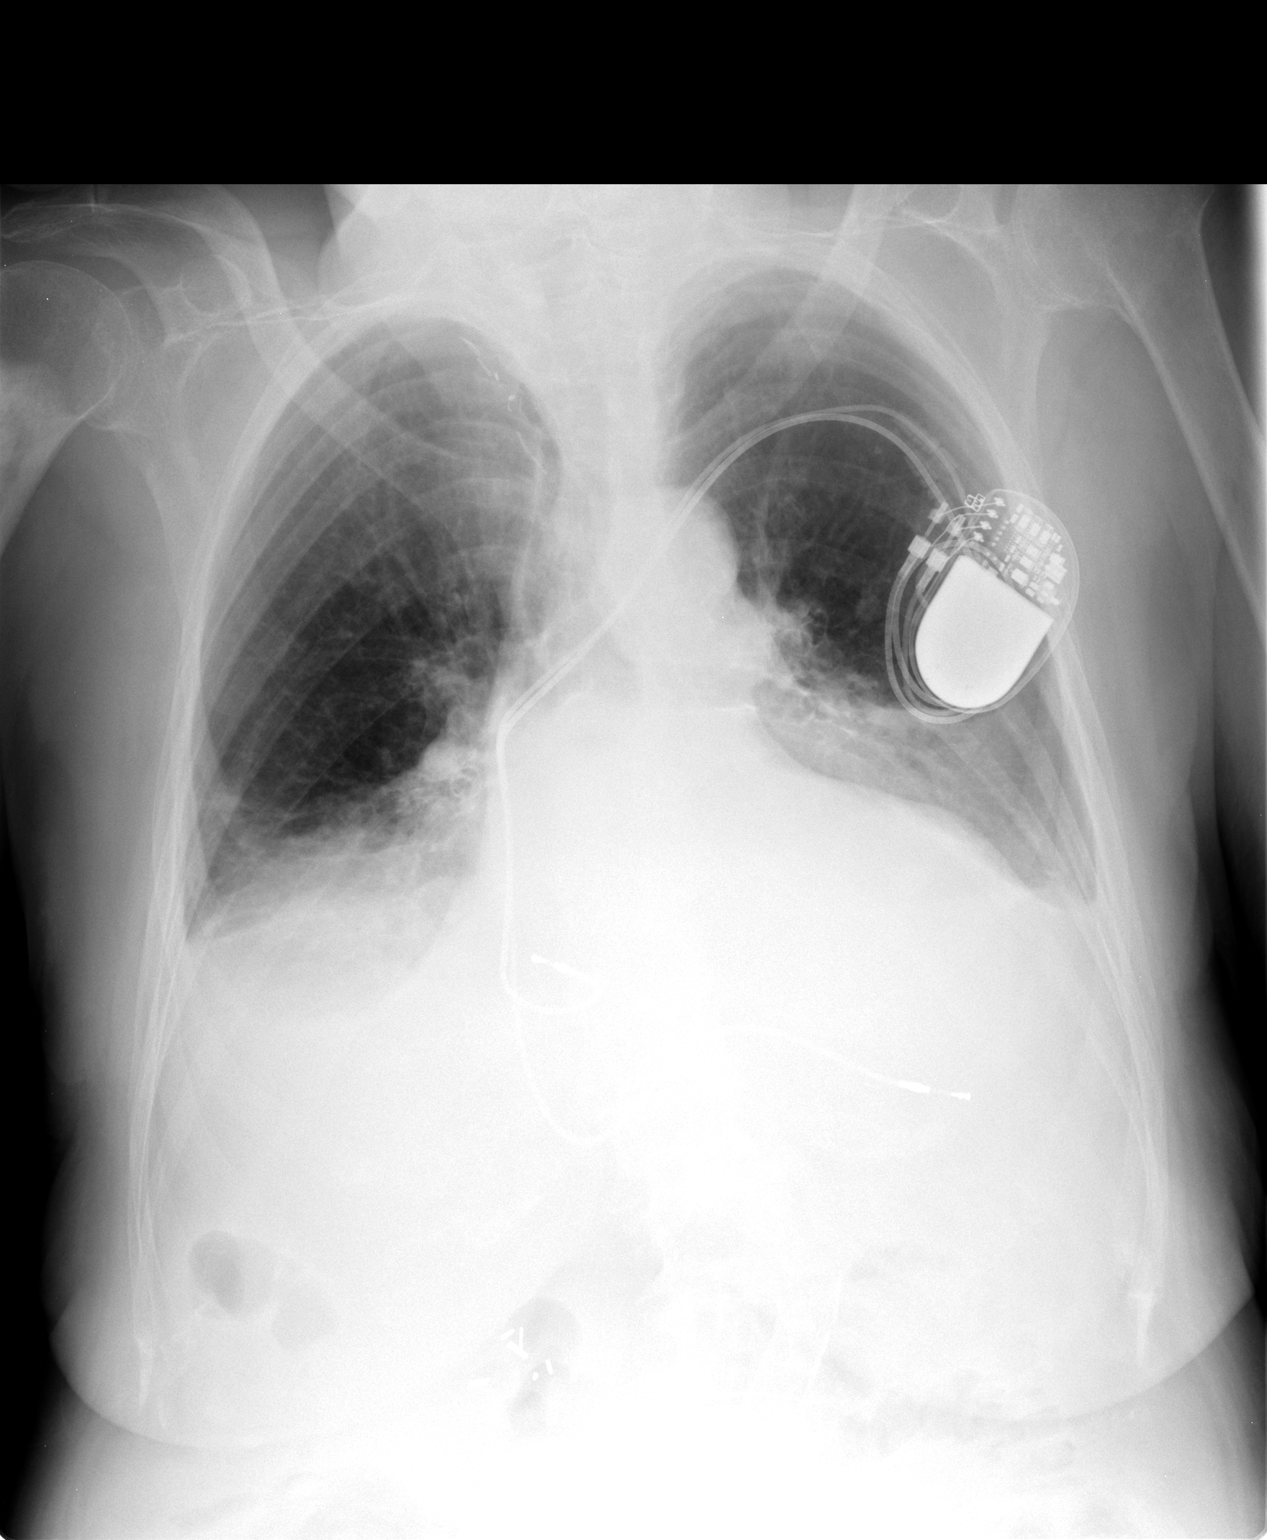

[1 of 1 positions shown; findings below may reference images not displayed]

FINDINGS: The lungs are slightly less well expanded. Bibasilar airspace
opacification is noted, new from the prior study. This could reflect
bibasilar pneumonia. Small bilateral pleural effusions are
suspected. No pneumothorax is seen.

The cardiomediastinal silhouette is mildly enlarged. A pacemaker is
noted overlying the left chest wall, with leads ending overlying the
right atrium and right ventricle. No acute osseous abnormalities are
seen. Clips are noted within the right upper quadrant, reflecting
prior cholecystectomy.
IMPRESSION: 1. Bibasilar airspace opacification, new from the prior study. This
could reflect bibasilar pneumonia. Small bilateral pleural effusions
suspected.
2. Mild cardiomegaly noted.
